# Patient Record
Sex: Male | Born: 1945 | Race: White | Hispanic: No | Marital: Married | Smoking: Never smoker
Health system: Southern US, Community
[De-identification: ages and names within clinical notes are randomized; demographics above are authoritative.]

## PROBLEM LIST (undated history)

## (undated) DIAGNOSIS — I10 Essential (primary) hypertension: Secondary | ICD-10-CM

## (undated) DIAGNOSIS — E7800 Pure hypercholesterolemia, unspecified: Secondary | ICD-10-CM

## (undated) DIAGNOSIS — I214 Non-ST elevation (NSTEMI) myocardial infarction: Secondary | ICD-10-CM

## (undated) DIAGNOSIS — I2510 Atherosclerotic heart disease of native coronary artery without angina pectoris: Secondary | ICD-10-CM

## (undated) HISTORY — PX: LUMBAR SPINE SURGERY: SHX701

## (undated) HISTORY — DX: Essential (primary) hypertension: I10

## (undated) HISTORY — PX: CERVICAL LAMINECTOMY: SHX94

## (undated) HISTORY — DX: Pure hypercholesterolemia, unspecified: E78.00

## (undated) HISTORY — DX: Non-ST elevation (NSTEMI) myocardial infarction: I21.4

## (undated) HISTORY — DX: Atherosclerotic heart disease of native coronary artery without angina pectoris: I25.10

---

## 2015-06-21 DIAGNOSIS — D229 Melanocytic nevi, unspecified: Secondary | ICD-10-CM | POA: Diagnosis not present

## 2015-07-17 DIAGNOSIS — R404 Transient alteration of awareness: Secondary | ICD-10-CM | POA: Diagnosis not present

## 2015-07-17 DIAGNOSIS — R531 Weakness: Secondary | ICD-10-CM | POA: Diagnosis not present

## 2015-07-17 DIAGNOSIS — E785 Hyperlipidemia, unspecified: Secondary | ICD-10-CM | POA: Diagnosis not present

## 2015-07-17 DIAGNOSIS — I119 Hypertensive heart disease without heart failure: Secondary | ICD-10-CM | POA: Diagnosis not present

## 2015-07-17 DIAGNOSIS — R509 Fever, unspecified: Secondary | ICD-10-CM | POA: Diagnosis not present

## 2015-07-17 DIAGNOSIS — R197 Diarrhea, unspecified: Secondary | ICD-10-CM | POA: Diagnosis not present

## 2015-07-17 DIAGNOSIS — R05 Cough: Secondary | ICD-10-CM | POA: Diagnosis not present

## 2015-07-17 DIAGNOSIS — I252 Old myocardial infarction: Secondary | ICD-10-CM | POA: Diagnosis not present

## 2015-07-17 DIAGNOSIS — G114 Hereditary spastic paraplegia: Secondary | ICD-10-CM | POA: Diagnosis not present

## 2015-07-17 DIAGNOSIS — Z7982 Long term (current) use of aspirin: Secondary | ICD-10-CM | POA: Diagnosis not present

## 2016-02-21 DIAGNOSIS — I214 Non-ST elevation (NSTEMI) myocardial infarction: Secondary | ICD-10-CM | POA: Diagnosis not present

## 2016-02-21 DIAGNOSIS — E7800 Pure hypercholesterolemia, unspecified: Secondary | ICD-10-CM | POA: Diagnosis not present

## 2016-02-21 DIAGNOSIS — I10 Essential (primary) hypertension: Secondary | ICD-10-CM | POA: Diagnosis not present

## 2016-02-21 DIAGNOSIS — I2510 Atherosclerotic heart disease of native coronary artery without angina pectoris: Secondary | ICD-10-CM | POA: Diagnosis not present

## 2016-06-22 DIAGNOSIS — R252 Cramp and spasm: Secondary | ICD-10-CM | POA: Diagnosis not present

## 2016-06-22 DIAGNOSIS — M545 Low back pain: Secondary | ICD-10-CM | POA: Diagnosis not present

## 2016-06-22 DIAGNOSIS — M47817 Spondylosis without myelopathy or radiculopathy, lumbosacral region: Secondary | ICD-10-CM | POA: Diagnosis not present

## 2016-06-22 DIAGNOSIS — M48061 Spinal stenosis, lumbar region without neurogenic claudication: Secondary | ICD-10-CM | POA: Diagnosis not present

## 2016-08-03 DIAGNOSIS — M47817 Spondylosis without myelopathy or radiculopathy, lumbosacral region: Secondary | ICD-10-CM | POA: Diagnosis not present

## 2016-08-03 DIAGNOSIS — M5126 Other intervertebral disc displacement, lumbar region: Secondary | ICD-10-CM | POA: Diagnosis not present

## 2016-08-03 DIAGNOSIS — I10 Essential (primary) hypertension: Secondary | ICD-10-CM | POA: Diagnosis not present

## 2016-08-03 DIAGNOSIS — M48061 Spinal stenosis, lumbar region without neurogenic claudication: Secondary | ICD-10-CM | POA: Diagnosis not present

## 2016-08-09 DIAGNOSIS — M542 Cervicalgia: Secondary | ICD-10-CM | POA: Diagnosis not present

## 2016-08-21 DIAGNOSIS — N390 Urinary tract infection, site not specified: Secondary | ICD-10-CM | POA: Diagnosis not present

## 2016-08-21 DIAGNOSIS — M48061 Spinal stenosis, lumbar region without neurogenic claudication: Secondary | ICD-10-CM | POA: Diagnosis not present

## 2016-08-21 DIAGNOSIS — G9741 Accidental puncture or laceration of dura during a procedure: Secondary | ICD-10-CM | POA: Diagnosis not present

## 2016-08-21 DIAGNOSIS — M48062 Spinal stenosis, lumbar region with neurogenic claudication: Secondary | ICD-10-CM | POA: Diagnosis not present

## 2016-08-21 DIAGNOSIS — G114 Hereditary spastic paraplegia: Secondary | ICD-10-CM | POA: Diagnosis not present

## 2016-08-23 DIAGNOSIS — R509 Fever, unspecified: Secondary | ICD-10-CM | POA: Diagnosis not present

## 2016-08-24 DIAGNOSIS — Z831 Family history of other infectious and parasitic diseases: Secondary | ICD-10-CM | POA: Diagnosis not present

## 2016-08-24 DIAGNOSIS — Z7982 Long term (current) use of aspirin: Secondary | ICD-10-CM | POA: Diagnosis not present

## 2016-08-24 DIAGNOSIS — Z820 Family history of epilepsy and other diseases of the nervous system: Secondary | ICD-10-CM | POA: Diagnosis not present

## 2016-08-24 DIAGNOSIS — M21371 Foot drop, right foot: Secondary | ICD-10-CM | POA: Diagnosis present

## 2016-08-24 DIAGNOSIS — G9741 Accidental puncture or laceration of dura during a procedure: Secondary | ICD-10-CM | POA: Diagnosis not present

## 2016-08-24 DIAGNOSIS — I2510 Atherosclerotic heart disease of native coronary artery without angina pectoris: Secondary | ICD-10-CM | POA: Diagnosis present

## 2016-08-24 DIAGNOSIS — R339 Retention of urine, unspecified: Secondary | ICD-10-CM | POA: Diagnosis not present

## 2016-08-24 DIAGNOSIS — M48062 Spinal stenosis, lumbar region with neurogenic claudication: Secondary | ICD-10-CM | POA: Diagnosis present

## 2016-08-24 DIAGNOSIS — Z79899 Other long term (current) drug therapy: Secondary | ICD-10-CM | POA: Diagnosis not present

## 2016-08-24 DIAGNOSIS — I252 Old myocardial infarction: Secondary | ICD-10-CM | POA: Diagnosis not present

## 2016-08-24 DIAGNOSIS — G114 Hereditary spastic paraplegia: Secondary | ICD-10-CM | POA: Diagnosis present

## 2016-08-24 DIAGNOSIS — N390 Urinary tract infection, site not specified: Secondary | ICD-10-CM | POA: Diagnosis not present

## 2016-08-24 DIAGNOSIS — R509 Fever, unspecified: Secondary | ICD-10-CM | POA: Diagnosis not present

## 2016-08-24 DIAGNOSIS — Z880 Allergy status to penicillin: Secondary | ICD-10-CM | POA: Diagnosis not present

## 2016-08-24 DIAGNOSIS — M21372 Foot drop, left foot: Secondary | ICD-10-CM | POA: Diagnosis present

## 2016-08-24 DIAGNOSIS — E7800 Pure hypercholesterolemia, unspecified: Secondary | ICD-10-CM | POA: Diagnosis present

## 2016-08-24 DIAGNOSIS — E785 Hyperlipidemia, unspecified: Secondary | ICD-10-CM | POA: Diagnosis not present

## 2016-08-24 DIAGNOSIS — B961 Klebsiella pneumoniae [K. pneumoniae] as the cause of diseases classified elsewhere: Secondary | ICD-10-CM | POA: Diagnosis not present

## 2016-08-24 DIAGNOSIS — I10 Essential (primary) hypertension: Secondary | ICD-10-CM | POA: Diagnosis present

## 2016-08-28 DIAGNOSIS — N400 Enlarged prostate without lower urinary tract symptoms: Secondary | ICD-10-CM | POA: Diagnosis present

## 2016-08-28 DIAGNOSIS — N390 Urinary tract infection, site not specified: Secondary | ICD-10-CM | POA: Diagnosis present

## 2016-08-28 DIAGNOSIS — B961 Klebsiella pneumoniae [K. pneumoniae] as the cause of diseases classified elsewhere: Secondary | ICD-10-CM | POA: Diagnosis present

## 2016-08-28 DIAGNOSIS — Z4789 Encounter for other orthopedic aftercare: Secondary | ICD-10-CM | POA: Diagnosis not present

## 2016-08-28 DIAGNOSIS — R509 Fever, unspecified: Secondary | ICD-10-CM | POA: Diagnosis not present

## 2016-08-28 DIAGNOSIS — E7800 Pure hypercholesterolemia, unspecified: Secondary | ICD-10-CM | POA: Diagnosis present

## 2016-08-28 DIAGNOSIS — M79604 Pain in right leg: Secondary | ICD-10-CM | POA: Diagnosis not present

## 2016-08-28 DIAGNOSIS — K5900 Constipation, unspecified: Secondary | ICD-10-CM | POA: Diagnosis present

## 2016-08-28 DIAGNOSIS — Z981 Arthrodesis status: Secondary | ICD-10-CM | POA: Diagnosis not present

## 2016-08-28 DIAGNOSIS — I2510 Atherosclerotic heart disease of native coronary artery without angina pectoris: Secondary | ICD-10-CM | POA: Diagnosis present

## 2016-08-28 DIAGNOSIS — M25551 Pain in right hip: Secondary | ICD-10-CM | POA: Diagnosis not present

## 2016-08-28 DIAGNOSIS — I252 Old myocardial infarction: Secondary | ICD-10-CM | POA: Diagnosis not present

## 2016-08-28 DIAGNOSIS — Z880 Allergy status to penicillin: Secondary | ICD-10-CM | POA: Diagnosis not present

## 2016-08-28 DIAGNOSIS — E785 Hyperlipidemia, unspecified: Secondary | ICD-10-CM | POA: Diagnosis present

## 2016-08-28 DIAGNOSIS — I10 Essential (primary) hypertension: Secondary | ICD-10-CM | POA: Diagnosis present

## 2016-08-28 DIAGNOSIS — M4800 Spinal stenosis, site unspecified: Secondary | ICD-10-CM | POA: Diagnosis not present

## 2016-09-09 DIAGNOSIS — I252 Old myocardial infarction: Secondary | ICD-10-CM | POA: Diagnosis not present

## 2016-09-09 DIAGNOSIS — G114 Hereditary spastic paraplegia: Secondary | ICD-10-CM | POA: Diagnosis not present

## 2016-09-09 DIAGNOSIS — M150 Primary generalized (osteo)arthritis: Secondary | ICD-10-CM | POA: Diagnosis not present

## 2016-09-09 DIAGNOSIS — Z7982 Long term (current) use of aspirin: Secondary | ICD-10-CM | POA: Diagnosis not present

## 2016-09-09 DIAGNOSIS — I2510 Atherosclerotic heart disease of native coronary artery without angina pectoris: Secondary | ICD-10-CM | POA: Diagnosis not present

## 2016-09-09 DIAGNOSIS — Z4789 Encounter for other orthopedic aftercare: Secondary | ICD-10-CM | POA: Diagnosis not present

## 2016-09-09 DIAGNOSIS — Z79891 Long term (current) use of opiate analgesic: Secondary | ICD-10-CM | POA: Diagnosis not present

## 2016-09-09 DIAGNOSIS — E785 Hyperlipidemia, unspecified: Secondary | ICD-10-CM | POA: Diagnosis not present

## 2016-09-09 DIAGNOSIS — I10 Essential (primary) hypertension: Secondary | ICD-10-CM | POA: Diagnosis not present

## 2016-09-11 DIAGNOSIS — E7800 Pure hypercholesterolemia, unspecified: Secondary | ICD-10-CM | POA: Diagnosis not present

## 2016-09-11 DIAGNOSIS — Z131 Encounter for screening for diabetes mellitus: Secondary | ICD-10-CM | POA: Diagnosis not present

## 2016-09-11 DIAGNOSIS — Z6825 Body mass index (BMI) 25.0-25.9, adult: Secondary | ICD-10-CM | POA: Diagnosis not present

## 2016-09-11 DIAGNOSIS — I2510 Atherosclerotic heart disease of native coronary artery without angina pectoris: Secondary | ICD-10-CM | POA: Diagnosis not present

## 2016-09-11 DIAGNOSIS — I10 Essential (primary) hypertension: Secondary | ICD-10-CM | POA: Diagnosis not present

## 2016-09-11 DIAGNOSIS — Z9181 History of falling: Secondary | ICD-10-CM | POA: Diagnosis not present

## 2016-09-12 DIAGNOSIS — G114 Hereditary spastic paraplegia: Secondary | ICD-10-CM | POA: Diagnosis not present

## 2016-09-12 DIAGNOSIS — I252 Old myocardial infarction: Secondary | ICD-10-CM | POA: Diagnosis not present

## 2016-09-12 DIAGNOSIS — Z4789 Encounter for other orthopedic aftercare: Secondary | ICD-10-CM | POA: Diagnosis not present

## 2016-09-12 DIAGNOSIS — I10 Essential (primary) hypertension: Secondary | ICD-10-CM | POA: Diagnosis not present

## 2016-09-12 DIAGNOSIS — I2510 Atherosclerotic heart disease of native coronary artery without angina pectoris: Secondary | ICD-10-CM | POA: Diagnosis not present

## 2016-09-12 DIAGNOSIS — M150 Primary generalized (osteo)arthritis: Secondary | ICD-10-CM | POA: Diagnosis not present

## 2016-09-14 DIAGNOSIS — M150 Primary generalized (osteo)arthritis: Secondary | ICD-10-CM | POA: Diagnosis not present

## 2016-09-14 DIAGNOSIS — I2510 Atherosclerotic heart disease of native coronary artery without angina pectoris: Secondary | ICD-10-CM | POA: Diagnosis not present

## 2016-09-14 DIAGNOSIS — G114 Hereditary spastic paraplegia: Secondary | ICD-10-CM | POA: Diagnosis not present

## 2016-09-14 DIAGNOSIS — I10 Essential (primary) hypertension: Secondary | ICD-10-CM | POA: Diagnosis not present

## 2016-09-14 DIAGNOSIS — Z4789 Encounter for other orthopedic aftercare: Secondary | ICD-10-CM | POA: Diagnosis not present

## 2016-09-14 DIAGNOSIS — I252 Old myocardial infarction: Secondary | ICD-10-CM | POA: Diagnosis not present

## 2016-09-19 DIAGNOSIS — I10 Essential (primary) hypertension: Secondary | ICD-10-CM | POA: Diagnosis not present

## 2016-09-19 DIAGNOSIS — I252 Old myocardial infarction: Secondary | ICD-10-CM | POA: Diagnosis not present

## 2016-09-19 DIAGNOSIS — M150 Primary generalized (osteo)arthritis: Secondary | ICD-10-CM | POA: Diagnosis not present

## 2016-09-19 DIAGNOSIS — G114 Hereditary spastic paraplegia: Secondary | ICD-10-CM | POA: Diagnosis not present

## 2016-09-19 DIAGNOSIS — Z4789 Encounter for other orthopedic aftercare: Secondary | ICD-10-CM | POA: Diagnosis not present

## 2016-09-19 DIAGNOSIS — I2510 Atherosclerotic heart disease of native coronary artery without angina pectoris: Secondary | ICD-10-CM | POA: Diagnosis not present

## 2016-09-26 DIAGNOSIS — Z4789 Encounter for other orthopedic aftercare: Secondary | ICD-10-CM | POA: Diagnosis not present

## 2016-09-26 DIAGNOSIS — I10 Essential (primary) hypertension: Secondary | ICD-10-CM | POA: Diagnosis not present

## 2016-09-26 DIAGNOSIS — M150 Primary generalized (osteo)arthritis: Secondary | ICD-10-CM | POA: Diagnosis not present

## 2016-09-26 DIAGNOSIS — G114 Hereditary spastic paraplegia: Secondary | ICD-10-CM | POA: Diagnosis not present

## 2016-09-26 DIAGNOSIS — I2510 Atherosclerotic heart disease of native coronary artery without angina pectoris: Secondary | ICD-10-CM | POA: Diagnosis not present

## 2016-09-26 DIAGNOSIS — I252 Old myocardial infarction: Secondary | ICD-10-CM | POA: Diagnosis not present

## 2016-09-28 DIAGNOSIS — I252 Old myocardial infarction: Secondary | ICD-10-CM | POA: Diagnosis not present

## 2016-09-28 DIAGNOSIS — Z4789 Encounter for other orthopedic aftercare: Secondary | ICD-10-CM | POA: Diagnosis not present

## 2016-09-28 DIAGNOSIS — I10 Essential (primary) hypertension: Secondary | ICD-10-CM | POA: Diagnosis not present

## 2016-09-28 DIAGNOSIS — I2510 Atherosclerotic heart disease of native coronary artery without angina pectoris: Secondary | ICD-10-CM | POA: Diagnosis not present

## 2016-09-28 DIAGNOSIS — M150 Primary generalized (osteo)arthritis: Secondary | ICD-10-CM | POA: Diagnosis not present

## 2016-09-28 DIAGNOSIS — G114 Hereditary spastic paraplegia: Secondary | ICD-10-CM | POA: Diagnosis not present

## 2016-10-02 DIAGNOSIS — M150 Primary generalized (osteo)arthritis: Secondary | ICD-10-CM | POA: Diagnosis not present

## 2016-10-02 DIAGNOSIS — I2510 Atherosclerotic heart disease of native coronary artery without angina pectoris: Secondary | ICD-10-CM | POA: Diagnosis not present

## 2016-10-02 DIAGNOSIS — G114 Hereditary spastic paraplegia: Secondary | ICD-10-CM | POA: Diagnosis not present

## 2016-10-02 DIAGNOSIS — Z4789 Encounter for other orthopedic aftercare: Secondary | ICD-10-CM | POA: Diagnosis not present

## 2016-10-02 DIAGNOSIS — I252 Old myocardial infarction: Secondary | ICD-10-CM | POA: Diagnosis not present

## 2016-10-02 DIAGNOSIS — I10 Essential (primary) hypertension: Secondary | ICD-10-CM | POA: Diagnosis not present

## 2016-10-05 DIAGNOSIS — M150 Primary generalized (osteo)arthritis: Secondary | ICD-10-CM | POA: Diagnosis not present

## 2016-10-05 DIAGNOSIS — G114 Hereditary spastic paraplegia: Secondary | ICD-10-CM | POA: Diagnosis not present

## 2016-10-05 DIAGNOSIS — I10 Essential (primary) hypertension: Secondary | ICD-10-CM | POA: Diagnosis not present

## 2016-10-05 DIAGNOSIS — Z4789 Encounter for other orthopedic aftercare: Secondary | ICD-10-CM | POA: Diagnosis not present

## 2016-10-05 DIAGNOSIS — I252 Old myocardial infarction: Secondary | ICD-10-CM | POA: Diagnosis not present

## 2016-10-05 DIAGNOSIS — I2510 Atherosclerotic heart disease of native coronary artery without angina pectoris: Secondary | ICD-10-CM | POA: Diagnosis not present

## 2016-10-17 DIAGNOSIS — M545 Low back pain: Secondary | ICD-10-CM | POA: Diagnosis not present

## 2016-10-17 DIAGNOSIS — G114 Hereditary spastic paraplegia: Secondary | ICD-10-CM | POA: Diagnosis not present

## 2016-10-19 DIAGNOSIS — G114 Hereditary spastic paraplegia: Secondary | ICD-10-CM | POA: Diagnosis not present

## 2016-10-19 DIAGNOSIS — M545 Low back pain: Secondary | ICD-10-CM | POA: Diagnosis not present

## 2016-10-24 DIAGNOSIS — G114 Hereditary spastic paraplegia: Secondary | ICD-10-CM | POA: Diagnosis not present

## 2016-10-24 DIAGNOSIS — M545 Low back pain: Secondary | ICD-10-CM | POA: Diagnosis not present

## 2016-10-26 DIAGNOSIS — G114 Hereditary spastic paraplegia: Secondary | ICD-10-CM | POA: Diagnosis not present

## 2016-10-26 DIAGNOSIS — M545 Low back pain: Secondary | ICD-10-CM | POA: Diagnosis not present

## 2016-10-31 DIAGNOSIS — M545 Low back pain: Secondary | ICD-10-CM | POA: Diagnosis not present

## 2016-10-31 DIAGNOSIS — G114 Hereditary spastic paraplegia: Secondary | ICD-10-CM | POA: Diagnosis not present

## 2016-11-02 DIAGNOSIS — M545 Low back pain: Secondary | ICD-10-CM | POA: Diagnosis not present

## 2016-11-02 DIAGNOSIS — G114 Hereditary spastic paraplegia: Secondary | ICD-10-CM | POA: Diagnosis not present

## 2016-11-07 DIAGNOSIS — M545 Low back pain: Secondary | ICD-10-CM | POA: Diagnosis not present

## 2016-11-07 DIAGNOSIS — G114 Hereditary spastic paraplegia: Secondary | ICD-10-CM | POA: Diagnosis not present

## 2016-11-09 DIAGNOSIS — G114 Hereditary spastic paraplegia: Secondary | ICD-10-CM | POA: Diagnosis not present

## 2016-11-09 DIAGNOSIS — M545 Low back pain: Secondary | ICD-10-CM | POA: Diagnosis not present

## 2016-11-14 DIAGNOSIS — M545 Low back pain: Secondary | ICD-10-CM | POA: Diagnosis not present

## 2016-11-14 DIAGNOSIS — G114 Hereditary spastic paraplegia: Secondary | ICD-10-CM | POA: Diagnosis not present

## 2016-11-16 DIAGNOSIS — G114 Hereditary spastic paraplegia: Secondary | ICD-10-CM | POA: Diagnosis not present

## 2016-11-16 DIAGNOSIS — M545 Low back pain: Secondary | ICD-10-CM | POA: Diagnosis not present

## 2016-11-22 DIAGNOSIS — M545 Low back pain: Secondary | ICD-10-CM | POA: Diagnosis not present

## 2016-11-22 DIAGNOSIS — G114 Hereditary spastic paraplegia: Secondary | ICD-10-CM | POA: Diagnosis not present

## 2016-11-23 DIAGNOSIS — R102 Pelvic and perineal pain: Secondary | ICD-10-CM | POA: Diagnosis not present

## 2016-11-23 DIAGNOSIS — M48061 Spinal stenosis, lumbar region without neurogenic claudication: Secondary | ICD-10-CM | POA: Diagnosis not present

## 2016-11-24 DIAGNOSIS — M545 Low back pain: Secondary | ICD-10-CM | POA: Diagnosis not present

## 2016-11-24 DIAGNOSIS — G114 Hereditary spastic paraplegia: Secondary | ICD-10-CM | POA: Diagnosis not present

## 2016-11-28 DIAGNOSIS — G114 Hereditary spastic paraplegia: Secondary | ICD-10-CM | POA: Diagnosis not present

## 2016-11-28 DIAGNOSIS — M545 Low back pain: Secondary | ICD-10-CM | POA: Diagnosis not present

## 2016-11-30 DIAGNOSIS — M545 Low back pain: Secondary | ICD-10-CM | POA: Diagnosis not present

## 2016-11-30 DIAGNOSIS — G114 Hereditary spastic paraplegia: Secondary | ICD-10-CM | POA: Diagnosis not present

## 2017-05-14 DIAGNOSIS — Z6825 Body mass index (BMI) 25.0-25.9, adult: Secondary | ICD-10-CM | POA: Diagnosis not present

## 2017-05-14 DIAGNOSIS — G114 Hereditary spastic paraplegia: Secondary | ICD-10-CM | POA: Diagnosis not present

## 2017-05-14 DIAGNOSIS — E785 Hyperlipidemia, unspecified: Secondary | ICD-10-CM | POA: Diagnosis not present

## 2017-05-24 DIAGNOSIS — Z125 Encounter for screening for malignant neoplasm of prostate: Secondary | ICD-10-CM | POA: Diagnosis not present

## 2017-05-24 DIAGNOSIS — Z0000 Encounter for general adult medical examination without abnormal findings: Secondary | ICD-10-CM | POA: Diagnosis not present

## 2017-05-24 DIAGNOSIS — E785 Hyperlipidemia, unspecified: Secondary | ICD-10-CM | POA: Diagnosis not present

## 2017-06-11 ENCOUNTER — Encounter: Payer: Self-pay | Admitting: Cardiovascular Disease

## 2017-06-11 ENCOUNTER — Ambulatory Visit (INDEPENDENT_AMBULATORY_CARE_PROVIDER_SITE_OTHER): Payer: Medicare Other | Admitting: Cardiovascular Disease

## 2017-06-11 ENCOUNTER — Other Ambulatory Visit: Payer: Self-pay

## 2017-06-11 VITALS — BP 138/78 | HR 60 | Ht 69.0 in | Wt 177.6 lb

## 2017-06-11 DIAGNOSIS — I10 Essential (primary) hypertension: Secondary | ICD-10-CM | POA: Diagnosis not present

## 2017-06-11 DIAGNOSIS — I252 Old myocardial infarction: Secondary | ICD-10-CM | POA: Diagnosis not present

## 2017-06-11 DIAGNOSIS — I25118 Atherosclerotic heart disease of native coronary artery with other forms of angina pectoris: Secondary | ICD-10-CM | POA: Diagnosis not present

## 2017-06-11 MED ORDER — PRAVASTATIN SODIUM 40 MG PO TABS: 40.0000 mg | ORAL_TABLET | Freq: Every day | ORAL | 3 refills | Status: DC

## 2017-06-11 MED ORDER — METOPROLOL SUCCINATE ER 25 MG PO TB24: 25.0000 mg | ORAL_TABLET | Freq: Every day | ORAL | 3 refills | Status: DC

## 2017-06-11 NOTE — Patient Instructions (Signed)
Your physician wants you to follow-up in:  1 year with Dr.Koneswaran You will receive a reminder letter in the mail two months in advance. If you don't receive a letter, please call our office to schedule the follow-up appointment.    Your physician recommends that you continue on your current medications as directed. Please refer to the Current Medication list given to you today.    If you need a refill on your cardiac medications before your next appointment, please call your pharmacy.      No lab work or tests ordered today.      Thank you for choosing [facility] !        

## 2017-06-11 NOTE — Progress Notes (Signed)
CARDIOLOGY CONSULT NOTE  Patient ID: Barry Powers MRN: [id number] DOB/AGE: [birth year] 71 y.o.  Admit date: (Not on file) Primary Physician: Celene Squibb, MD Referring Physician: Dr. Nevada Crane  Reason for Consultation: Coronary artery disease  HPI: Barry Powers is a 72 y.o. male who is being seen today for the evaluation of coronary artery disease at the request of Dr. Wende Neighbors.  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  He apparently underwent coronary angiography and was found to have a subtotal occlusion of a small diagonal branch of the LAD which was too small for intervention.  He had minor nonobstructive disease otherwise.  ECG performed today which I personally reviewed demonstrated sinus rhythm with precordial T wave inversions.  I have no old ECG to compare to.  The patient denies any symptoms of chest pain, palpitations, shortness of breath, lightheadedness, dizziness, leg swelling, orthopnea, PND, and syncope.  He is here with his wife who is also my patient.  He served with the [employer] for 20 years and retired early in 1991 due to health conditions.  He has a history of hereditary spastic paraplegia and apparently his father had this as well.  He was evaluated at [facility] who said there is nothing that can be done.    Allergies  Allergen Reactions  . Penicillins Other (See Comments)    "Blacked-out"    Current Outpatient Medications  Medication Sig Dispense Refill  . aspirin EC 81 MG tablet Take 81 mg by mouth daily.    . metoprolol succinate (TOPROL-XL) 25 MG 24 hr tablet Take 25 mg by mouth daily.    . nitroGLYCERIN (NITROSTAT) 0.4 MG SL tablet Place 0.4 mg under the tongue every 5 (five) minutes as needed for chest pain.    . pravastatin (PRAVACHOL) 40 MG tablet Take 40 mg by mouth daily.     No current facility-administered medications for this  visit.     Past Medical History:  Diagnosis Date  . Coronary artery disease   . Hypercholesteremia   . Hypertension   . NSTEMI (non-ST elevated myocardial infarction) [facility])     History reviewed. No pertinent surgical history.  Social History   Socioeconomic History  . Marital status: Married    Spouse name: Not on file  . Number of children: Not on file  . Years of education: Not on file  . Highest education level: Not on file  Social Needs  . Financial resource strain: Not on file  . Food insecurity - worry: Not on file  . Food insecurity - inability: Not on file  . Transportation needs - medical: Not on file  . Transportation needs - non-medical: Not on file  Occupational History  . Not on file  Tobacco Use  . Smoking status: Never Smoker  . Smokeless tobacco: Never Used  Substance and Sexual Activity  . Alcohol use: No    Frequency: Never  . Drug use: No  . Sexual activity: Not on file  Other Topics Concern  . Not on file  Social History Narrative  . Not on file     No family history of premature CAD in 1st degree relatives.  No outpatient medications have been marked as taking for the 06/11/17 encounter (Office Visit) with Herminio Commons, MD.      Review of systems complete and found to be  negative unless listed above in HPI    Physical exam Blood pressure (!) 148/70, pulse 61, height 5\' 9"  (1.753 m), weight 177 lb 9.6 oz (80.6 kg), SpO2 97 %. General: NAD Neck: No JVD, no thyromegaly or thyroid nodule.  Lungs: Clear to auscultation bilaterally with normal respiratory effort. CV: Nondisplaced PMI. Regular rate and rhythm, normal S1/S2, no S3/S4, no murmur.  No peripheral edema.  No carotid bruit.    Abdomen: Soft, nontender, no distention.  Skin: Intact without lesions or rashes.  Neurologic: Alert and oriented x 3.  Psych: Normal affect. Extremities: No clubbing or cyanosis.  HEENT: Normal.   ECG: Most recent ECG reviewed.   Labs: No  results found for: K, BUN, CREATININE, ALT, TSH, HGB   Lipids: No results found for: LDLCALC, LDLDIRECT, CHOL, TRIG, HDL      ASSESSMENT AND PLAN:  1.  Coronary artery disease with history of non-STEMI with subtotal occlusion of small diagonal branch and otherwise minor nonobstructive disease: Symptomatically stable.  Currently on aspirin, Toprol-XL 25 mg, and pravastatin 40 mg.  No changes to therapy. I will obtain a copy of cardiac records from his prior cardiologist including any cardiac testing including echocardiograms and cardiac catheterization report as well as old ECGs.  2.  Hypertension: Blood pressure is elevated today.  He is on Toprol-XL 25 mg daily.  I will monitor to see if further antihypertensive titration is indicated.   Disposition: Follow up in 1 year  Signed: Kate Sable, M.D., F.A.C.C.  06/11/2017, 9:26 AM

## 2017-06-14 DIAGNOSIS — Z0000 Encounter for general adult medical examination without abnormal findings: Secondary | ICD-10-CM | POA: Diagnosis not present

## 2017-06-14 DIAGNOSIS — R972 Elevated prostate specific antigen [PSA]: Secondary | ICD-10-CM | POA: Diagnosis not present

## 2017-06-14 DIAGNOSIS — Z713 Dietary counseling and surveillance: Secondary | ICD-10-CM | POA: Diagnosis not present

## 2017-06-14 DIAGNOSIS — Z6825 Body mass index (BMI) 25.0-25.9, adult: Secondary | ICD-10-CM | POA: Diagnosis not present

## 2017-06-14 DIAGNOSIS — E785 Hyperlipidemia, unspecified: Secondary | ICD-10-CM | POA: Diagnosis not present

## 2017-10-01 DIAGNOSIS — H52223 Regular astigmatism, bilateral: Secondary | ICD-10-CM | POA: Diagnosis not present

## 2017-10-01 DIAGNOSIS — H524 Presbyopia: Secondary | ICD-10-CM | POA: Diagnosis not present

## 2017-10-01 DIAGNOSIS — H5203 Hypermetropia, bilateral: Secondary | ICD-10-CM | POA: Diagnosis not present

## 2017-10-01 DIAGNOSIS — H2513 Age-related nuclear cataract, bilateral: Secondary | ICD-10-CM | POA: Diagnosis not present

## 2018-06-12 ENCOUNTER — Ambulatory Visit (INDEPENDENT_AMBULATORY_CARE_PROVIDER_SITE_OTHER): Payer: Medicare Other | Admitting: Cardiovascular Disease

## 2018-06-12 ENCOUNTER — Encounter: Payer: Self-pay | Admitting: Cardiovascular Disease

## 2018-06-12 VITALS — BP 142/82 | HR 56 | Ht 69.0 in | Wt 174.0 lb

## 2018-06-12 DIAGNOSIS — I252 Old myocardial infarction: Secondary | ICD-10-CM

## 2018-06-12 DIAGNOSIS — I25118 Atherosclerotic heart disease of native coronary artery with other forms of angina pectoris: Secondary | ICD-10-CM | POA: Diagnosis not present

## 2018-06-12 DIAGNOSIS — I10 Essential (primary) hypertension: Secondary | ICD-10-CM

## 2018-06-12 MED ORDER — NITROGLYCERIN 0.4 MG SL SUBL: 0.4000 mg | SUBLINGUAL_TABLET | SUBLINGUAL | 3 refills | Status: DC | PRN

## 2018-06-12 NOTE — Progress Notes (Addendum)
SUBJECTIVE: The patient presents for routine follow-up.  He sustained a non-STEMI in August 2017 and used to be followed by [facility].  He apparently underwent coronary angiography and was found to have a subtotal occlusion of a small diagonal branch of the LAD which was too small for intervention.  He had minor nonobstructive disease otherwise.  He has a history of hereditary spastic paraplegia and apparently his father had this as well.  He was evaluated at [facility] who said there is nothing that can be done.  The patient denies any symptoms of chest pain, palpitations, shortness of breath, lightheadedness, dizziness, leg swelling, orthopnea, PND, and syncope.  ECG performed today which I ordered and personally interpreted demonstrates sinus bradycardia, 53 bpm, LVH, and diffuse nonspecific ST segment and T wave abnormalities.   Social history: His wife is also my patient. He served with the [employer] for 20 years and retired early in 1991 due to health conditions.  Review of Systems: As per "subjective", otherwise negative.  Allergies  Allergen Reactions  . Penicillins Other (See Comments)    "Blacked-out"    Current Outpatient Medications  Medication Sig Dispense Refill  . aspirin EC 81 MG tablet Take 81 mg by mouth daily.    . metoprolol succinate (TOPROL-XL) 25 MG 24 hr tablet Take 1 tablet (25 mg total) by mouth daily. 90 tablet 3  . nitroGLYCERIN (NITROSTAT) 0.4 MG SL tablet Place 0.4 mg under the tongue every 5 (five) minutes as needed for chest pain.    . pravastatin (PRAVACHOL) 40 MG tablet Take 1 tablet (40 mg total) by mouth daily. 90 tablet 3   No current facility-administered medications for this visit.     Past Medical History:  Diagnosis Date  . Coronary artery disease   . Hypercholesteremia   . Hypertension   . NSTEMI (non-ST elevated myocardial infarction) [facility])     Past Surgical History:  Procedure Laterality Date  .  CERVICAL LAMINECTOMY    . LUMBAR SPINE SURGERY      Social History   Socioeconomic History  . Marital status: Married    Spouse name: Not on file  . Number of children: Not on file  . Years of education: Not on file  . Highest education level: Not on file  Occupational History  . Not on file  Social Needs  . Financial resource strain: Not on file  . Food insecurity:    Worry: Not on file    Inability: Not on file  . Transportation needs:    Medical: Not on file    Non-medical: Not on file  Tobacco Use  . Smoking status: Never Smoker  . Smokeless tobacco: Never Used  Substance and Sexual Activity  . Alcohol use: No    Frequency: Never  . Drug use: No  . Sexual activity: Not on file  Lifestyle  . Physical activity:    Days per week: Not on file    Minutes per session: Not on file  . Stress: Not on file  Relationships  . Social connections:    Talks on phone: Not on file    Gets together: Not on file    Attends religious service: Not on file    Active member of club or organization: Not on file    Attends meetings of clubs or organizations: Not on file    Relationship status: Not on file  . Intimate partner violence:    Fear of  current or ex partner: Not on file    Emotionally abused: Not on file    Physically abused: Not on file    Forced sexual activity: Not on file  Other Topics Concern  . Not on file  Social History Narrative  . Not on file     Vitals:   06/12/18 1427  BP: (!) 142/82  Pulse: (!) 56  SpO2: 97%  Weight: 174 lb (78.9 kg)  Height: 5\' 9"  (1.753 m)    Wt Readings from Last 3 Encounters:  06/12/18 174 lb (78.9 kg)  06/11/17 177 lb 9.6 oz (80.6 kg)  04/26/17 171 lb (77.6 kg)     PHYSICAL EXAM General: NAD HEENT: Normal. Neck: No JVD, no thyromegaly. Lungs: Clear to auscultation bilaterally with normal respiratory effort. CV: Bradycardic, regular rhythm, normal S1/S2, no S3/S4, no murmur. No pretibial or periankle edema.  No carotid  bruit.   Abdomen: Soft, nontender, no distention.  Neurologic: Alert and oriented.  Psych: Normal affect. Skin: Normal. Musculoskeletal: No gross deformities.    ECG: Reviewed above under Subjective   Labs: No results found for: K, BUN, CREATININE, ALT, TSH, HGB   Lipids: No results found for: LDLCALC, LDLDIRECT, CHOL, TRIG, HDL     ASSESSMENT AND PLAN: 1.  Coronary artery disease: He has a history of non-STEMI with subtotal occlusion of small diagonal branch and otherwise minor nonobstructive disease: Symptomatically stable.  Currently on aspirin, Toprol-XL 25 mg, and pravastatin 40 mg.  No changes to therapy.  I will refill nitroglycerin.  I will check a lipid panel.  2.  Hypertension: BP is mildly elevated.  He said he was in a hurry to get here.  No changes to therapy today.    Disposition: Follow up 1 year.   Kate Sable, M.D., F.A.C.C.

## 2018-06-12 NOTE — Patient Instructions (Signed)
Medication Instructions:  Continue all current medications.  Labwork:  Lipids - order given today.   Reminder:  Nothing to eat or drink after 12 midnight prior to labs.  Office will contact with results via phone or letter.    Follow-Up: Your physician wants you to follow up in:  1 year.  You will receive a reminder letter in the mail one-two months in advance.  If you don't receive a letter, please call our office to schedule the follow up appointment   Any Other Special Instructions Will Be Listed Below (If Applicable).  If you need a refill on your cardiac medications before your next appointment, please call your pharmacy.

## 2018-08-23 ENCOUNTER — Other Ambulatory Visit: Payer: Self-pay | Admitting: Cardiovascular Disease

## 2018-08-26 ENCOUNTER — Other Ambulatory Visit: Payer: Self-pay | Admitting: Cardiovascular Disease

## 2018-08-26 MED ORDER — METOPROLOL SUCCINATE ER 25 MG PO TB24: 25.0000 mg | ORAL_TABLET | Freq: Every day | ORAL | 3 refills | Status: DC

## 2018-08-26 NOTE — Telephone Encounter (Signed)
Refilled toprol

## 2018-08-26 NOTE — Telephone Encounter (Signed)
Needs to have refill on Toprol XL   [facility], [place], [place]

## 2018-12-09 ENCOUNTER — Other Ambulatory Visit: Payer: Self-pay | Admitting: Cardiovascular Disease

## 2019-06-20 ENCOUNTER — Ambulatory Visit: Payer: Medicare Other | Attending: Internal Medicine

## 2019-06-20 DIAGNOSIS — Z23 Encounter for immunization: Secondary | ICD-10-CM

## 2019-06-20 NOTE — Progress Notes (Signed)
   Covid-19 Vaccination Clinic  Name:  Barry Powers    MRN: [id number] DOB: 1945-12-11  06/20/2019  [name] was observed post Covid-19 immunization for 15 minutes without incident. He was provided with Vaccine Information Sheet and instruction to access the V-Safe system.   [name] was instructed to call 911 with any severe reactions post vaccine: [name] Kitchen Difficulty breathing  . Swelling of face and throat  . A fast heartbeat  . A bad rash all over body  . Dizziness and weakness   Immunizations Administered    Name Date Dose VIS Date Route   Moderna COVID-19 Vaccine 06/20/2019  8:23 AM 0.5 mL 03/11/2019 Intramuscular   Manufacturer: Moderna   Lot: [id number]   [id number]

## 2019-07-15 ENCOUNTER — Other Ambulatory Visit: Payer: Self-pay | Admitting: *Deleted

## 2019-07-15 MED ORDER — PRAVASTATIN SODIUM 40 MG PO TABS: 40.0000 mg | ORAL_TABLET | Freq: Every day | ORAL | 0 refills | Status: DC

## 2019-07-16 ENCOUNTER — Ambulatory Visit: Payer: Medicare Other | Admitting: Cardiovascular Disease

## 2019-07-23 ENCOUNTER — Ambulatory Visit: Payer: Medicare Other | Attending: Internal Medicine

## 2019-07-23 DIAGNOSIS — Z23 Encounter for immunization: Secondary | ICD-10-CM

## 2019-07-23 NOTE — Progress Notes (Signed)
   Covid-19 Vaccination Clinic  Name:  Barry Powers    MRN: [id number] DOB: 04-Mar-1946  07/23/2019  [name] was observed post Covid-19 immunization for 30 minutes based on pre-vaccination screening without incident. He was provided with Vaccine Information Sheet and instruction to access the V-Safe system.   [name] was instructed to call 911 with any severe reactions post vaccine: [name] Kitchen Difficulty breathing  . Swelling of face and throat  . A fast heartbeat  . A bad rash all over body  . Dizziness and weakness   Immunizations Administered    Name Date Dose VIS Date Route   Moderna COVID-19 Vaccine 07/23/2019  8:13 AM 0.5 mL 03/11/2019 Intramuscular   Manufacturer: Moderna   LotMV:[id number]   [place]BE:[id number]

## 2019-07-23 NOTE — Progress Notes (Signed)
   Covid-19 Vaccination Clinic  Name:  Barry Powers    MRN: [id number] DOB: 07/11/1945  07/23/2019  [name] was observed post Covid-19 immunization for 30 minutes based on pre-vaccination screening without incident. He was provided with Vaccine Information Sheet and instruction to access the V-Safe system.   [name] was instructed to call 911 with any severe reactions post vaccine: [name] Kitchen Difficulty breathing  . Swelling of face and throat  . A fast heartbeat  . A bad rash all over body  . Dizziness and weakness   Immunizations Administered    Name Date Dose VIS Date Route   Moderna COVID-19 Vaccine 07/23/2019  8:13 AM 0.5 mL 03/11/2019 Intramuscular   Manufacturer: Moderna   LotMV:[id number]   [place]BE:[id number]

## 2019-08-29 ENCOUNTER — Encounter: Payer: Self-pay | Admitting: Cardiovascular Disease

## 2019-08-29 ENCOUNTER — Ambulatory Visit (INDEPENDENT_AMBULATORY_CARE_PROVIDER_SITE_OTHER): Payer: Medicare Other | Admitting: Cardiovascular Disease

## 2019-08-29 ENCOUNTER — Other Ambulatory Visit: Payer: Self-pay

## 2019-08-29 VITALS — BP 130/80 | HR 54 | Ht 69.0 in | Wt 177.0 lb

## 2019-08-29 DIAGNOSIS — H524 Presbyopia: Secondary | ICD-10-CM | POA: Diagnosis not present

## 2019-08-29 DIAGNOSIS — I252 Old myocardial infarction: Secondary | ICD-10-CM

## 2019-08-29 DIAGNOSIS — I10 Essential (primary) hypertension: Secondary | ICD-10-CM | POA: Diagnosis not present

## 2019-08-29 DIAGNOSIS — I25118 Atherosclerotic heart disease of native coronary artery with other forms of angina pectoris: Secondary | ICD-10-CM | POA: Diagnosis not present

## 2019-08-29 DIAGNOSIS — H5203 Hypermetropia, bilateral: Secondary | ICD-10-CM | POA: Diagnosis not present

## 2019-08-29 DIAGNOSIS — H2513 Age-related nuclear cataract, bilateral: Secondary | ICD-10-CM | POA: Diagnosis not present
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
## 2019-08-29 NOTE — Patient Instructions (Signed)

## 2019-11-03 ENCOUNTER — Other Ambulatory Visit: Payer: Self-pay | Admitting: Family Medicine

## 2019-11-04 ENCOUNTER — Other Ambulatory Visit: Payer: Self-pay | Admitting: Family Medicine

## 2019-11-04 MED ORDER — METOPROLOL SUCCINATE ER 25 MG PO TB24: 25.0000 mg | ORAL_TABLET | Freq: Every day | ORAL | 3 refills | Status: DC

## 2020-03-18 ENCOUNTER — Ambulatory Visit: Payer: Medicare Other | Attending: Internal Medicine

## 2020-03-18 DIAGNOSIS — Z23 Encounter for immunization: Secondary | ICD-10-CM

## 2020-03-18 NOTE — Progress Notes (Signed)
   Covid-19 Vaccination Clinic  Name:  Barry Powers    MRN: [id number] DOB: [birth year]  03/18/2020  Mr. College was observed post Covid-19 immunization for 15 minutes without incident. He was provided with Vaccine Information Sheet and instruction to access the V-Safe system.   [name] was instructed to call 911 with any severe reactions post vaccine: [name] Kitchen Difficulty breathing  . Swelling of face and throat  . A fast heartbeat  . A bad rash all over body  . Dizziness and weakness   Immunizations Administered    No immunizations on file.

## 2020-03-18 NOTE — Progress Notes (Signed)
   Covid-19 Vaccination Clinic  Name:  Barry Powers    MRN: [id number] DOB: November 07, 1945  03/18/2020  [name] was observed post Covid-19 immunization for 30 minutes based on pre-vaccination screening without incident. He was provided with Vaccine Information Sheet and instruction to access the V-Safe system.   [name] was instructed to call 911 with any severe reactions post vaccine: [name] Kitchen Difficulty breathing  . Swelling of face and throat  . A fast heartbeat  . A bad rash all over body  . Dizziness and weakness   Immunizations Administered    No immunizations on file.

## 2020-09-27 ENCOUNTER — Telehealth: Payer: Self-pay | Admitting: Cardiology

## 2020-09-27 MED ORDER — PRAVASTATIN SODIUM 40 MG PO TABS: 40.0000 mg | ORAL_TABLET | Freq: Every day | ORAL | 1 refills | Status: DC

## 2020-09-27 NOTE — Telephone Encounter (Signed)
   Can provide 30-days with 1 refill. Will need appointment for additional refills.   Signed, Erma Heritage, PA-C 09/27/2020, 4:46 PM Pager: [phone]

## 2020-09-27 NOTE — Telephone Encounter (Signed)
New message     *STAT* If patient is at the pharmacy, call can be transferred to refill team.   1. Which medications need to be refilled? (please list name of each medication and dose if known)  pravastatin (PRAVACHOL) 40 MG tablet  2. Which pharmacy/location (including street and city if local pharmacy) is medication to be sent to? [facility] in [place]  3. Do they need a 30 day or 90 day supply?30   Informed patient he needs an appointment, he said he does not want to make one right now

## 2020-09-27 NOTE — Telephone Encounter (Signed)
Medication refill request approved and sent to the pharmacy.

## 2020-10-05 ENCOUNTER — Telehealth: Payer: Self-pay | Admitting: Cardiology

## 2020-10-05 MED ORDER — METOPROLOL SUCCINATE ER 25 MG PO TB24: 25.0000 mg | ORAL_TABLET | Freq: Every day | ORAL | 1 refills | Status: DC

## 2020-10-05 NOTE — Telephone Encounter (Signed)
*  STAT* If patient is at the pharmacy, call can be transferred to refill team.   1. Which medications need to be refilled? (please list name of each medication and dose if known)  metoprolol succinate (TOPROL-XL) 25 MG 24 hr tablet [[id number]]   2. Which pharmacy/location (including street and city if local pharmacy) is medication to be sent to? [place]   3. Do they need a 30 day or 90 day supply?  30 day

## 2020-10-25 ENCOUNTER — Telehealth: Payer: Self-pay | Admitting: Cardiology

## 2020-10-25 NOTE — Telephone Encounter (Signed)
States that he has not seen Dr. Nevada Crane in quite some time & is getting new pcp - waiting on Dr. Quintin Alto to send papers for new patient but has not received yet.  States that something is going on with his hip and can't walk good.  Needs referral before neurologist will see him.  Patient last seen 08/29/2019.    Advised patient that she can discuss with provider at [facility] in August as this should be addressed by pcp.  Advised her to contact [place] office in regards to the new patient appointment.  She verbalized understanding.

## 2020-10-25 NOTE — Telephone Encounter (Signed)
Patient's wife(Linda) called wanted Dr  Domenic Polite  to send referral for a Neurologist. They can be reached at  ph# [phone]

## 2020-11-08 NOTE — Progress Notes (Addendum)
Cardiology Office Note  Date: 11/09/2020   ID: Hashim, Sermersheim 1945-09-21, MRN [id number]  PCP:  Celene Squibb, MD  Cardiologist:  None Electrophysiologist:  None   Chief Complaint: 1 year follow-up  History of Present Illness: POLLARD NOU is a 75 y.o. male with a history of CAD, HLD, HTN, history of NSTEMI.  He was last seen by Dr. Bronson Ing on 08/29/2019.  He had a previous non-STEMI in August 2017 and underwent coronary angiography and was found to have a subtotal occlusion of a small diagonal branch of the LAD which was too small for intervention.  Had minor nonobstructive disease otherwise.  History of hereditary spastic paraplegia.  He denied any chest pain, palpitation, shortness of breath, lightheadedness, dizziness, swelling, orthopnea, PND, syncope.  He was symptomatically stable with his CAD.  He was currently taking aspirin, Toprol-XL 25 mg daily along with pravastatin 40 mg daily.  There were no changes to therapy.  Blood pressure was normal and no changes to therapy.  He is here today for 1 year follow-up.  He denies any recent acute illnesses or hospitalizations.  Denies any recent anginal or exertional symptoms or nitroglycerin use.  Denies any DOE or SOB.  Denies any orthostatic symptoms, CVA or TIA-like symptoms, PND, orthopnea, bleeding.  Denies any claudication-like symptoms.  Denies any DVT or PE-like symptoms, or lower extremity edema.  He does have a history of hereditary spastic paraplegia.  He states he is having issues with frequent falls due to weakness in his legs and he has some numbness in his right knee area.  He states he feels as though the weakness is getting worse and would like to be referred to neurology for evaluation.   Past Medical History:  Diagnosis Date   Coronary artery disease    Hypercholesteremia    Hypertension    NSTEMI (non-ST elevated myocardial infarction) [facility])     Past Surgical History:  Procedure Laterality Date   CERVICAL  LAMINECTOMY     LUMBAR SPINE SURGERY      Current Outpatient Medications  Medication Sig Dispense Refill   aspirin EC 81 MG tablet Take 81 mg by mouth daily.     nitroGLYCERIN (NITROSTAT) 0.4 MG SL tablet Place 1 tablet (0.4 mg total) under the tongue every 5 (five) minutes as needed for chest pain. 25 tablet 3   metoprolol succinate (TOPROL-XL) 25 MG 24 hr tablet Take 1 tablet (25 mg total) by mouth daily. 90 tablet 2   pravastatin (PRAVACHOL) 40 MG tablet Take 1 tablet (40 mg total) by mouth daily. 90 tablet 3   No current facility-administered medications for this visit.   Allergies:  Penicillins   Social History: The patient  reports that he has never smoked. He has never used smokeless tobacco. He reports that he does not drink alcohol and does not use drugs.   Family History: The patient's family history includes Alzheimer's disease in his mother; Aneurysm in his father; COPD in his sister.   ROS:  Please see the history of present illness. Otherwise, complete review of systems is positive for none.  All other systems are reviewed and negative.   Physical Exam: VS:  BP 134/80   Pulse 72   Ht '5\' 9"'$  (1.753 m)   Wt 170 lb 3.2 oz (77.2 kg)   SpO2 96%   BMI 25.13 kg/m , BMI Body mass index is 25.13 kg/m.  Wt Readings from Last 3 Encounters:  11/09/20 170 lb  3.2 oz (77.2 kg)  08/29/19 177 lb (80.3 kg)  06/12/18 174 lb (78.9 kg)    General: Patient appears comfortable at rest. Neck: Supple, no elevated JVP or carotid bruits, no thyromegaly. Lungs: Clear to auscultation, nonlabored breathing at rest. Cardiac: Regular rate and rhythm, no S3 or significant systolic murmur, no pericardial rub. Extremities: No pitting edema, distal pulses 2+. Skin: Warm and dry. Musculoskeletal: No kyphosis. Neuropsychiatric: Alert and oriented x3, affect grossly appropriate.  ECG: 11/09/2020 EKG normal sinus rhythm rate of 69, LVH with repolarization abnormality.  Recent Labwork: No results  found for requested labs within last 8760 hours.  No results found for: CHOL, TRIG, HDL, CHOLHDL, VLDL, LDLCALC, LDLDIRECT  Other Studies Reviewed Today:   Assessment and Plan:  1. CAD in native artery   2. Essential hypertension   3. Mixed hyperlipidemia   4. Frequent falls   5. Weakness    1. CAD in native artery Currently denies any anginal or exertional symptoms or nitroglycerin use.  Continue aspirin 81 mg daily.  Continue sublingual nitroglycerin as needed.  2. Essential hypertension Blood pressure is reasonably controlled today at 134/80.  Patient states BP is usually better at home.  Continue Toprol-XL 25 mg daily.  3. Mixed hyperlipidemia Continue pravastatin 40 mg daily.  4. Frequent falls Patient states he is having issues with frequent falls due to muscle weakness.  He has a history of hereditary spastic paraplegia.  He has not seen a neurologist in quite some time  5. Weakness in legs Complaining of progressive leg weakness with increased falls as noted above.  He states he feels as though the weakness has progressed.  He would like to be referred to a local neurologist.  Please refer to Dr. Merlene Laughter at [facility] neurology.  Medication Adjustments/Labs and Tests Ordered: Current medicines are reviewed at length with the patient today.  Concerns regarding medicines are outlined above.   Disposition: Follow-up with Dr. Domenic Polite or APP 1 year  Signed, Levell July, NP 11/09/2020 12:23 PM    [facility], [place], [place] [address] Phone: [phone]; Fax: [phone]

## 2020-11-09 ENCOUNTER — Ambulatory Visit (INDEPENDENT_AMBULATORY_CARE_PROVIDER_SITE_OTHER): Payer: Medicare Other | Admitting: Family Medicine

## 2020-11-09 ENCOUNTER — Encounter: Payer: Self-pay | Admitting: Family Medicine

## 2020-11-09 VITALS — BP 134/80 | HR 72 | Ht 69.0 in | Wt 170.2 lb

## 2020-11-09 DIAGNOSIS — I2510 Atherosclerotic heart disease of native coronary artery without angina pectoris: Secondary | ICD-10-CM

## 2020-11-09 DIAGNOSIS — R296 Repeated falls: Secondary | ICD-10-CM

## 2020-11-09 DIAGNOSIS — I10 Essential (primary) hypertension: Secondary | ICD-10-CM | POA: Diagnosis not present

## 2020-11-09 DIAGNOSIS — R531 Weakness: Secondary | ICD-10-CM | POA: Diagnosis not present

## 2020-11-09 DIAGNOSIS — E782 Mixed hyperlipidemia: Secondary | ICD-10-CM

## 2020-11-09 MED ORDER — METOPROLOL SUCCINATE ER 25 MG PO TB24
25.0000 mg | ORAL_TABLET | Freq: Every day | ORAL | 2 refills | Status: DC
Start: 2020-11-09 — End: 2022-01-30

## 2020-11-09 MED ORDER — PRAVASTATIN SODIUM 40 MG PO TABS: 40.0000 mg | ORAL_TABLET | Freq: Every day | ORAL | 3 refills | Status: DC

## 2020-11-09 NOTE — Patient Instructions (Addendum)
Medication Instructions:  Your physician recommends that you continue on your current medications as directed. Please refer to the Current Medication list given to you today.  Labwork: none  Testing/Procedures: none  Follow-Up: Your physician recommends that you schedule a follow-up appointment in: 1 year. You will receive a reminder call or letter in the mail in about 10 months reminding you to call and schedule your appointment. If you don't receive this letter, please contact our office.  Any Other Special Instructions Will Be Listed Below (If Applicable). You have been referred to Dr. Merlene Laughter  If you need a refill on your cardiac medications before your next appointment, please call your pharmacy.

## 2020-11-18 ENCOUNTER — Other Ambulatory Visit: Payer: Self-pay | Admitting: Neurology

## 2020-11-18 ENCOUNTER — Other Ambulatory Visit (HOSPITAL_COMMUNITY): Payer: Self-pay | Admitting: Neurology

## 2020-11-18 DIAGNOSIS — I10 Essential (primary) hypertension: Secondary | ICD-10-CM | POA: Diagnosis not present

## 2020-11-18 DIAGNOSIS — R296 Repeated falls: Secondary | ICD-10-CM | POA: Diagnosis not present

## 2020-11-18 DIAGNOSIS — G8220 Paraplegia, unspecified: Secondary | ICD-10-CM | POA: Diagnosis not present

## 2020-11-18 DIAGNOSIS — G629 Polyneuropathy, unspecified: Secondary | ICD-10-CM | POA: Diagnosis not present

## 2020-11-18 DIAGNOSIS — I2510 Atherosclerotic heart disease of native coronary artery without angina pectoris: Secondary | ICD-10-CM | POA: Diagnosis not present

## 2020-11-18 DIAGNOSIS — E785 Hyperlipidemia, unspecified: Secondary | ICD-10-CM | POA: Diagnosis not present

## 2020-12-02 ENCOUNTER — Ambulatory Visit (HOSPITAL_COMMUNITY)
Admission: RE | Admit: 2020-12-02 | Discharge: 2020-12-02 | Disposition: A | Discharge status: Home / Self Care | Payer: Medicare Other | Source: Ambulatory Visit | Attending: Neurology | Admitting: Neurology

## 2020-12-02 ENCOUNTER — Other Ambulatory Visit: Payer: Self-pay

## 2020-12-02 DIAGNOSIS — R296 Repeated falls: Secondary | ICD-10-CM | POA: Insufficient documentation

## 2020-12-02 DIAGNOSIS — M542 Cervicalgia: Secondary | ICD-10-CM | POA: Diagnosis not present

## 2020-12-02 IMAGING — MR MR CERVICAL SPINE W/O CM
5 series · 36 of 48 positions shown · non-contrast
Comparison: None.

CLINICAL DATA: 75-year-old male with posterior neck pain radiating
through the spine. Difficulty walking. Multiple recent falls.

EXAM:
MRI CERVICAL SPINE WITHOUT CONTRAST
TECHNIQUE: Multiplanar, multisequence MR imaging of the cervical spine was
performed. No intravenous contrast was administered.

[Series 5: T2 · sagittal · 3.0mm · 0.69mm/px · 6 of 13 slices shown (1 of 2)]
[im 1/13]
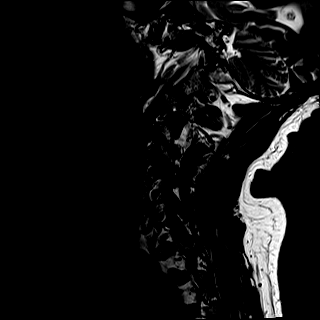
[im 3/13]
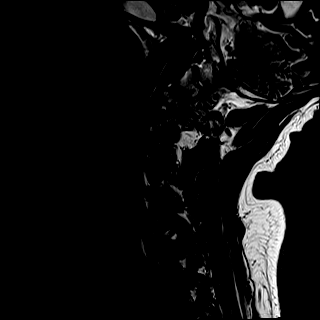
[im 5/13]
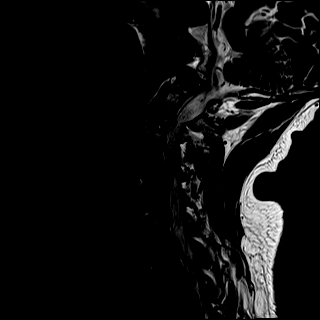
[im 8/13]
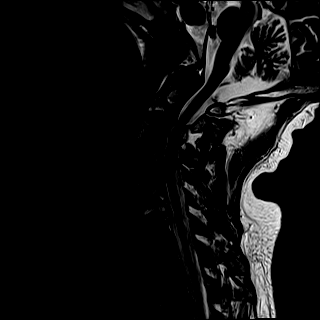
[im 10/13]
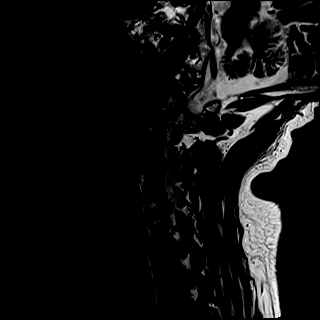
[im 13/13]
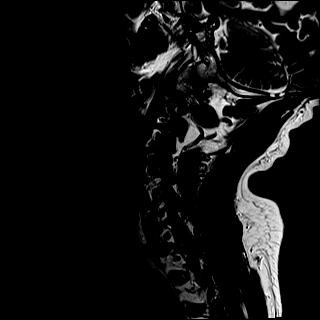

[Series 6: T1 · sagittal · 3.0mm · 0.86mm/px · 6 of 13 slices shown]
[im 1/13]
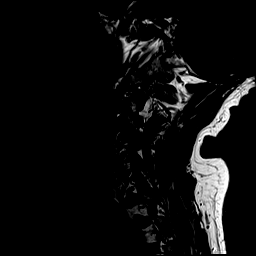
[im 3/13]
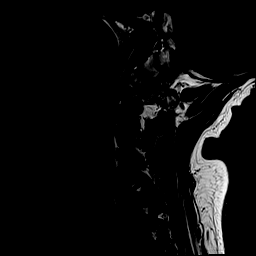
[im 5/13]
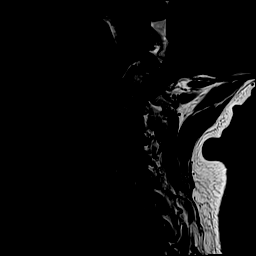
[im 8/13]
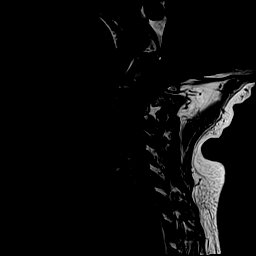
[im 10/13]
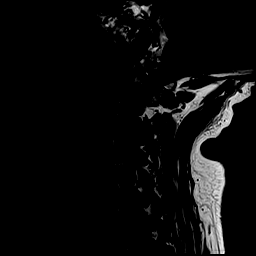
[im 13/13]
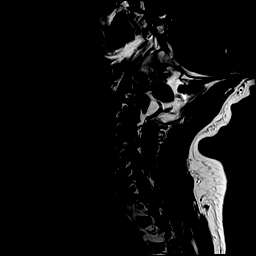

[Series 7: STIR · sagittal · 3.0mm · 0.69mm/px · 6 of 13 slices shown]
[im 1/13]
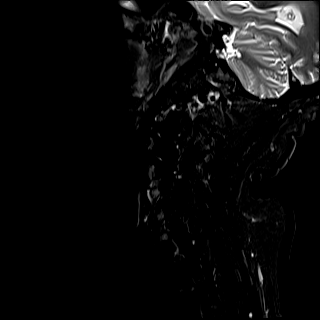
[im 3/13]
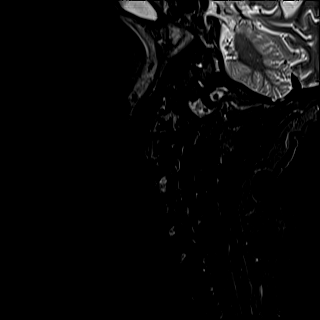
[im 5/13]
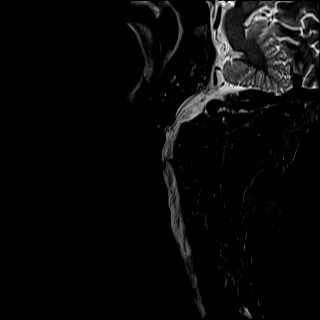
[im 8/13]
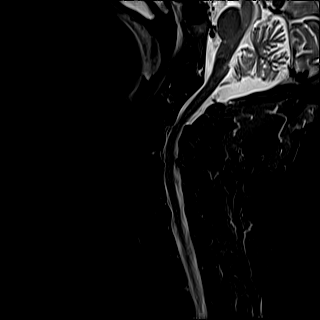
[im 10/13]
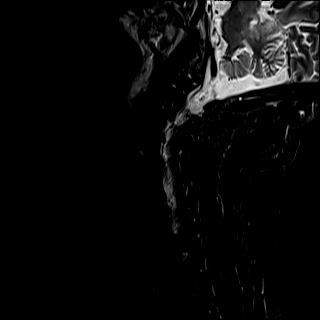
[im 13/13]
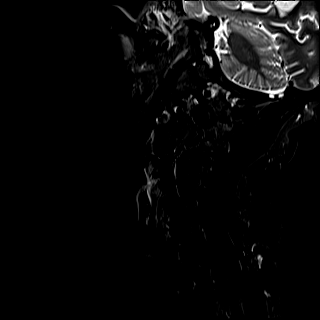

[Series 8: T2 · axial · 3.0mm · 0.70mm/px · z∈[-134,-36]mm · 10 of 32 slices shown (2 of 2)]
[im 1/32]
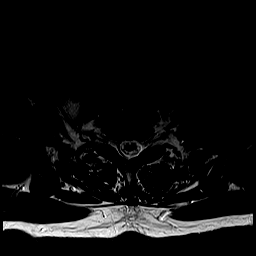
[im 3/32]
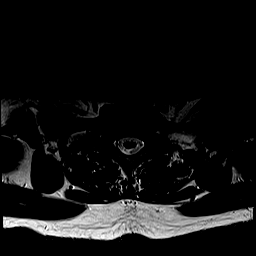
[im 5/32]
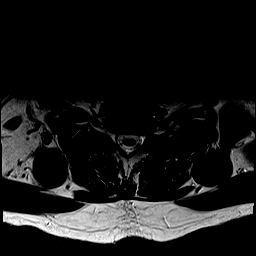
[im 9/32]
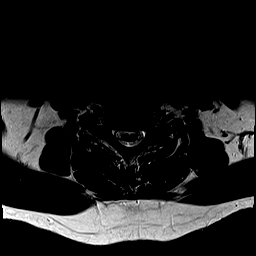
[im 14/32]
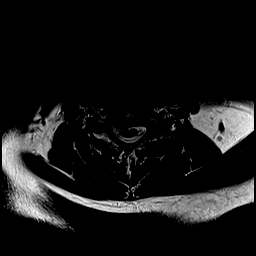
[im 16/32]
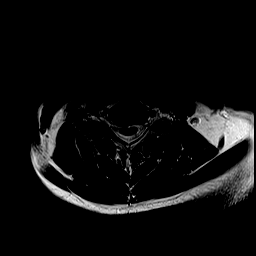
[im 18/32]
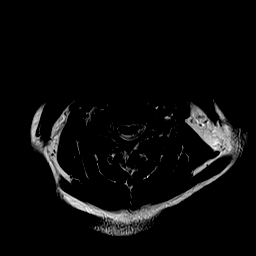
[im 23/32]
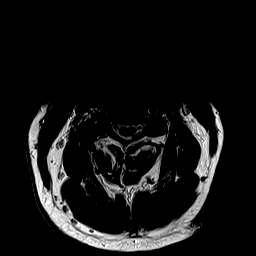
[im 27/32]
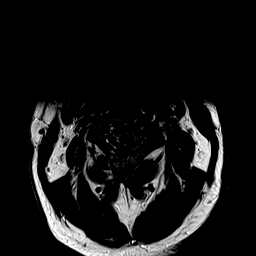
[im 32/32]
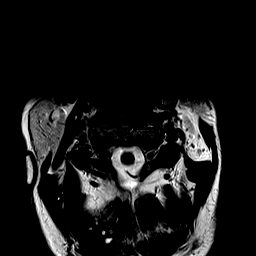

[Series 9: GRE · axial · 3.0mm · 0.35mm/px · z∈[-134,-36]mm · 8 of 32 slices shown]
[im 1/32]
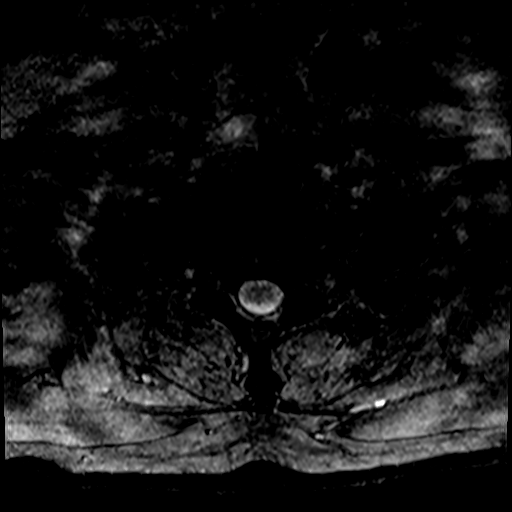
[im 5/32]
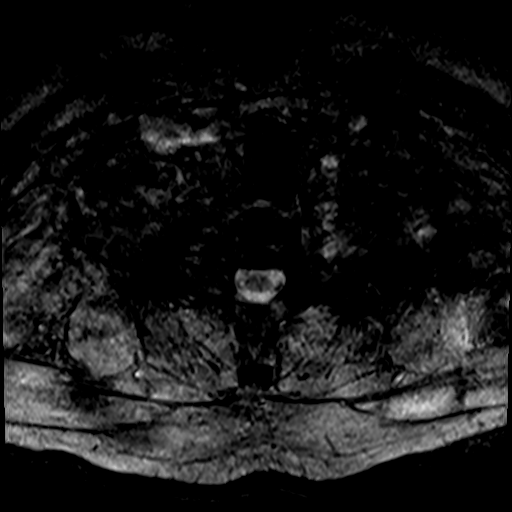
[im 9/32]
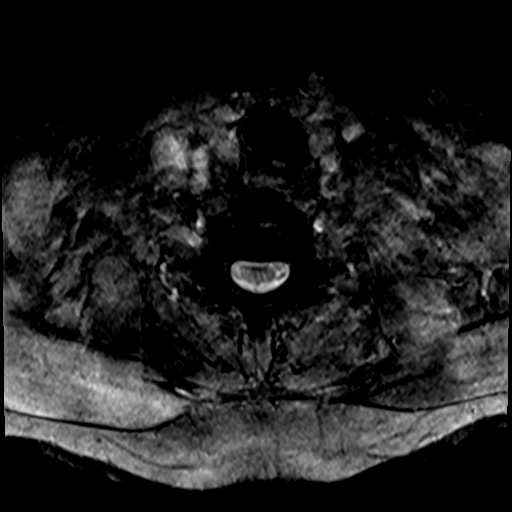
[im 14/32]
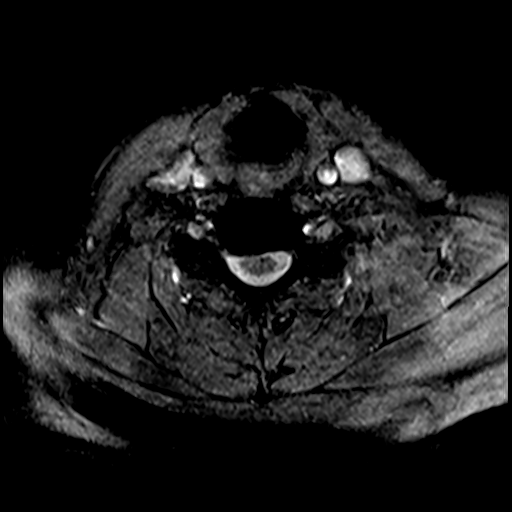
[im 18/32]
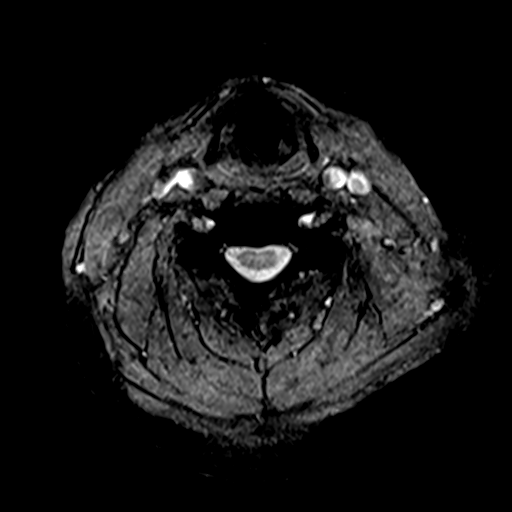
[im 23/32]
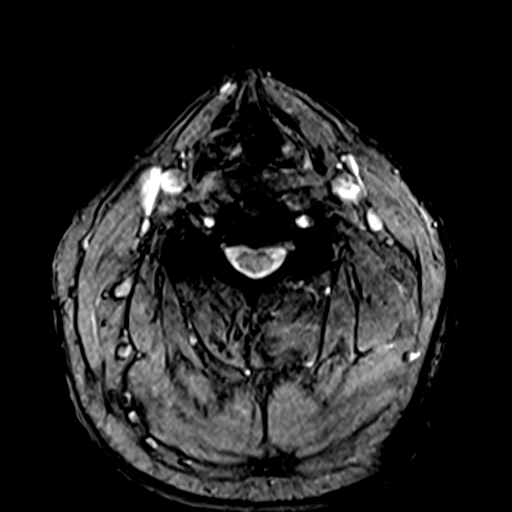
[im 27/32]
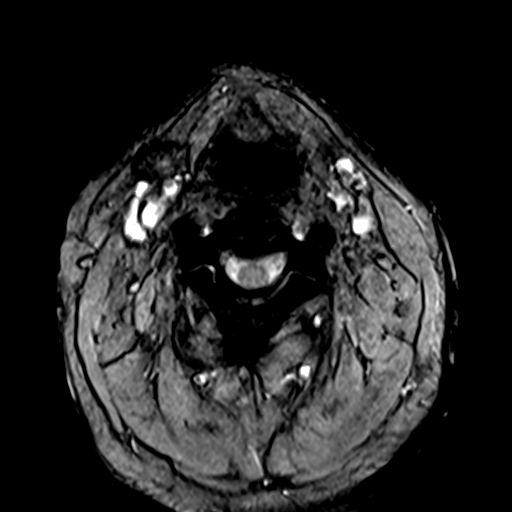
[im 32/32]
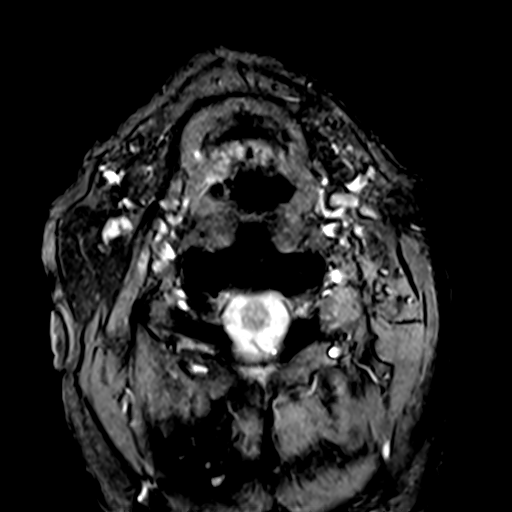

[36 of 48 positions shown; findings below may reference images not displayed]

FINDINGS: Alignment: Straightening of lower cervical lordosis and mildly
exaggerated upper cervical lordosis. Cervicothoracic junction
alignment is within normal limits.

Vertebrae: No marrow edema or evidence of acute osseous abnormality.
Normal background bone marrow signal. Chronic endplate degenerative
marrow signal changes at C6-C7, also C2-C3.

Chronic interbody ankylosis at C5-C6, appears acquired rather than
congenital.

Cord: No cervical spinal cord signal abnormality despite some
degenerative cord mass effect detailed below. Below C4-C5 the
visible spinal canal is capacious.

Posterior Fossa, vertebral arteries, paraspinal tissues:
Cervicomedullary junction is within normal limits. Grossly negative
visible brain parenchyma, posterior fossa. Preserved major vascular
flow voids in the neck, with dominant appearing left vertebral
artery. Negative visible neck soft tissues and lung apices.

Disc levels:

C2-C3: Subtle retrolisthesis with disc desiccation. Circumferential
disc bulge and endplate spurring with broad-based posterior
component. Mild to moderate ligament flavum and facet hypertrophy.
Mild spinal stenosis. Mild if any cord mass effect. Mild bilateral
C3 foraminal stenosis.

C3-C4: Disc desiccation and disc bulging. Mild endplate spurring.
Mild-to-moderate facet and right side ligament flavum hypertrophy.
No significant spinal stenosis. Moderate left and moderate to severe
right C4 neural foraminal stenosis.

C4-C5: Disc desiccation. Circumferential disc bulge and small
superimposed right paracentral disc protrusion with annular fissure.
Mild facet and ligament flavum hypertrophy. Mild spinal stenosis and
ventral cord mass effect. Severe left and moderate to severe right
C5 foraminal stenosis.

C5-C6:  Interbody ankylosis.  No stenosis.

C6-C7: Disc desiccation and disc space loss. Circumferential disc
osteophyte complex. Broad-based posterior component with no spinal
stenosis. Moderate to severe bilateral C7 foraminal stenosis.

C7-T1:  Negative.

No visible upper thoracic spinal stenosis.
IMPRESSION: 1. Chronic interbody ankylosis at C5-C6.
2. Adjacent segment disease at both C4-C5 and C6-C7. Mild spinal
stenosis and mild cord mass effect at the former. Moderate to severe
bilateral C5 and C7 neural foraminal stenosis.
3. Advanced degeneration also at C2-C3 with mild spinal stenosis.
Mild if any cord mass effect.
4. No cervical cord signal abnormality.

## 2020-12-07 ENCOUNTER — Ambulatory Visit: Payer: Medicare Other | Admitting: Orthopaedic Surgery

## 2020-12-14 ENCOUNTER — Other Ambulatory Visit: Payer: Self-pay

## 2020-12-14 ENCOUNTER — Ambulatory Visit: Payer: Medicare Other

## 2020-12-14 ENCOUNTER — Ambulatory Visit (INDEPENDENT_AMBULATORY_CARE_PROVIDER_SITE_OTHER): Payer: Medicare Other | Admitting: Orthopaedic Surgery

## 2020-12-14 ENCOUNTER — Encounter: Payer: Self-pay | Admitting: Orthopaedic Surgery

## 2020-12-14 VITALS — BP 156/79 | HR 73 | Ht 69.0 in | Wt 163.0 lb

## 2020-12-14 DIAGNOSIS — I2510 Atherosclerotic heart disease of native coronary artery without angina pectoris: Secondary | ICD-10-CM

## 2020-12-14 DIAGNOSIS — G114 Hereditary spastic paraplegia: Secondary | ICD-10-CM | POA: Diagnosis not present

## 2020-12-14 DIAGNOSIS — M545 Low back pain, unspecified: Secondary | ICD-10-CM

## 2020-12-14 DIAGNOSIS — M25551 Pain in right hip: Secondary | ICD-10-CM

## 2020-12-14 NOTE — Progress Notes (Signed)
Subjective:    Patient ID: Barry Powers, male    DOB: 1945/12/05, 75 y.o.   MRN: [id number]  HPI My back and right leg don't work right.  He had a fall in late June or early July and has been having pain in the lower back and right hip since then.  He is gradually getting worse.  He says he loses control of his leg on the right at times.  He has spasm of the right lateral thigh at times.  He falls often.  This has been getting worse recently.  He has seen Dr. Merlene Laughter for this.  I have copies of his notes.  He had MRI of the cervical spine done.  MRI showed: IMPRESSION: 1. 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 2. 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 Mild spinal stenosis and mild cord mass effect at the former. Moderate to severe bilateral C5 and C7 neural foraminal stenosis. 3. Advanced degeneration also at C2-C3 with mild spinal stenosis. Mild if any cord mass effect. 4. No cervical cord signal abnormality.    He also has Hereditary spastic paraplegia, found out years ago at [facility].  He was told of it getting worse as he aged. He did not tell me of this, but his wife did when he went to the bathroom.  Dr. Merlene Laughter has also listed it in his notes.  He is concerned his leg weakness and back pain are not improving.  He has no other extremity problem.   Review of Systems  Constitutional:  Positive for activity change.  Musculoskeletal:  Positive for arthralgias, back pain, gait problem and myalgias.  Neurological:  Positive for weakness.       Hereditary Spastic Paraplegia  All other systems reviewed and are negative. For Review of Systems, all other systems reviewed and are negative.  The following is a summary of the past history medically, past history surgically, known current medicines, social history and family history.  This information is gathered electronically by the computer from prior information and documentation.  I review this each visit and have found  including this information at this point in the chart is beneficial and informative.   Past Medical History:  Diagnosis Date   Coronary artery disease    Hypercholesteremia    Hypertension    NSTEMI (non-ST elevated myocardial infarction) [facility])     Past Surgical History:  Procedure Laterality Date   CERVICAL LAMINECTOMY     LUMBAR SPINE SURGERY      Current Outpatient Medications on File Prior to Visit  Medication Sig Dispense Refill   aspirin EC 81 MG tablet Take 81 mg by mouth daily.     metoprolol succinate (TOPROL-XL) 25 MG 24 hr tablet Take 1 tablet (25 mg total) by mouth daily. 90 tablet 2   nitroGLYCERIN (NITROSTAT) 0.4 MG SL tablet Place 1 tablet (0.4 mg total) under the tongue every 5 (five) minutes as needed for chest pain. 25 tablet 3   pravastatin (PRAVACHOL) 40 MG tablet Take 1 tablet (40 mg total) by mouth daily. 90 tablet 3   No current facility-administered medications on file prior to visit.    Social History   Socioeconomic History   Marital status: Married    Spouse name: Not on file   Number of children: Not on file   Years of education: Not on file   Highest education level: Not on file  Occupational History   Not on file  Tobacco Use  Smoking status: Never   Smokeless tobacco: Never  Vaping Use   Vaping Use: Never used  Substance and Sexual Activity   Alcohol use: No   Drug use: No   Sexual activity: Not on file  Other Topics Concern   Not on file  Social History Narrative   Not on file   Social Determinants of Health   Financial Resource Strain: Not on file  Food Insecurity: Not on file  Transportation Needs: Not on file  Physical Activity: Not on file  Stress: Not on file  Social Connections: Not on file  Intimate Partner Violence: Not on file    Family History  Problem Relation Age of Onset   Alzheimer's disease Mother    Aneurysm Father    COPD Sister     BP (!) 156/79   Pulse 73   Ht '5\' 9"'$  (1.753 m)   Wt 163 lb (73.9  kg)   BMI 24.07 kg/m   Body mass index is 24.07 kg/m.     Objective:   Physical Exam Vitals and nursing note reviewed. Exam conducted with a chaperone present.  Constitutional:      Appearance: He is well-developed.  HENT:     Head: Normocephalic and atraumatic.  Eyes:     Conjunctiva/sclera: Conjunctivae normal.     Pupils: Pupils are equal, round, and reactive to light.  Cardiovascular:     Rate and Rhythm: Normal rate and regular rhythm.  Pulmonary:     Effort: Pulmonary effort is normal.  Abdominal:     Palpations: Abdomen is soft.  Musculoskeletal:       Arms:     Cervical back: Normal range of motion and neck supple.  Skin:    General: Skin is warm and dry.  Neurological:     Mental Status: He is alert and oriented to person, place, and time.     Cranial Nerves: No cranial nerve deficit.     Motor: Weakness present. No abnormal muscle tone.     Coordination: Coordination abnormal.     Gait: Gait abnormal.     Deep Tendon Reflexes: Reflexes abnormal.     Comments: He has fibrillations of the lateral quad muscle on the right.  He has weakness of the right leg unable to lift his leg to cross to the left leg.  He has weakness on the right leg when standing and loses ability to stand after 15 seconds, and falls.  ROM of the hips is good.  Reflexes are decreased left and right.  Sensation intact.  He has some lower back pain on the right.  Main problem is weakness.  Psychiatric:        Behavior: Behavior normal.        Thought Content: Thought content normal.        Judgment: Judgment normal.  X-rays were done of the pelvis and right hip and lower back, reported separately.        Assessment & Plan:   Encounter Diagnoses  Name Primary?   Pain in right hip Yes   Acute midline low back pain without sciatica    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[place])    I will get MRI of the lumbar spine and pelvic area.  He has osteophytes of the lateral pelvis.  His main problem  may be related to the hereditary spastic paraplegia but I want to make sure there is no defect in lumbar spine or pelvis first.  He is to use walker.  He  is to limit walking.  Return in two weeks.  Call if any problem.  Precautions discussed.  Electronically Signed Sanjuana Kava, MD 9/6/202211:34 AM

## 2020-12-29 ENCOUNTER — Ambulatory Visit (HOSPITAL_COMMUNITY)
Admission: RE | Admit: 2020-12-29 | Discharge: 2020-12-29 | Disposition: A | Discharge status: Home / Self Care | Payer: Medicare Other | Source: Ambulatory Visit | Attending: Orthopaedic Surgery | Admitting: Orthopaedic Surgery

## 2020-12-29 ENCOUNTER — Other Ambulatory Visit: Payer: Self-pay

## 2020-12-29 DIAGNOSIS — M25551 Pain in right hip: Secondary | ICD-10-CM | POA: Diagnosis not present

## 2020-12-29 DIAGNOSIS — M48061 Spinal stenosis, lumbar region without neurogenic claudication: Secondary | ICD-10-CM | POA: Diagnosis not present

## 2020-12-29 DIAGNOSIS — M25552 Pain in left hip: Secondary | ICD-10-CM | POA: Diagnosis not present

## 2020-12-29 DIAGNOSIS — M545 Low back pain, unspecified: Secondary | ICD-10-CM | POA: Diagnosis not present

## 2020-12-29 DIAGNOSIS — M47816 Spondylosis without myelopathy or radiculopathy, lumbar region: Secondary | ICD-10-CM | POA: Diagnosis not present

## 2020-12-29 DIAGNOSIS — K4090 Unilateral inguinal hernia, without obstruction or gangrene, not specified as recurrent: Secondary | ICD-10-CM | POA: Diagnosis not present

## 2020-12-29 IMAGING — MR MR LUMBAR SPINE W/O CM
6 of 7 series · 35 of 48 positions shown · non-contrast
Comparison: Radiographs [DATE]

CLINICAL DATA: Acute midline low back pain without sciatica.

EXAM:
MRI LUMBAR SPINE WITHOUT CONTRAST
TECHNIQUE: Multiplanar, multisequence MR imaging of the lumbar spine was
performed. No intravenous contrast was administered.

[Series 7: T2 · sagittal · 4.0mm · 0.68mm/px · 5 of 15 slices shown (1 of 3)]
[im 1/15]
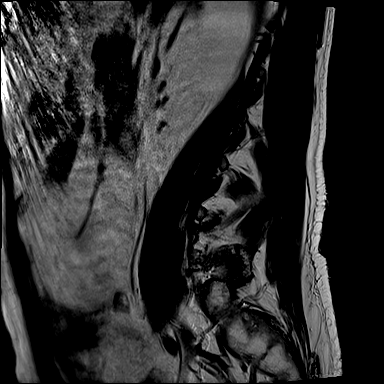
[im 4/15]
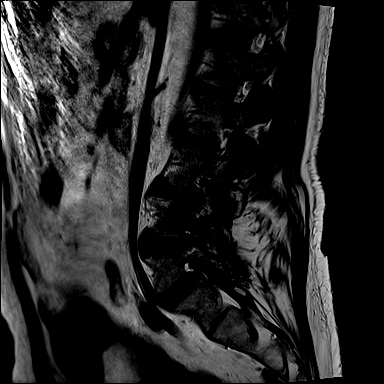
[im 8/15]
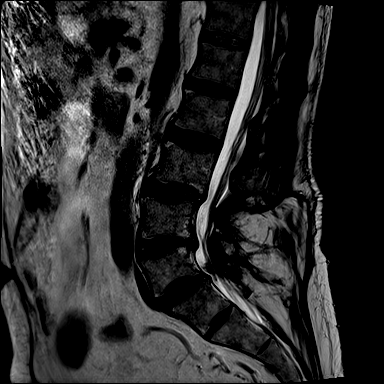
[im 11/15]
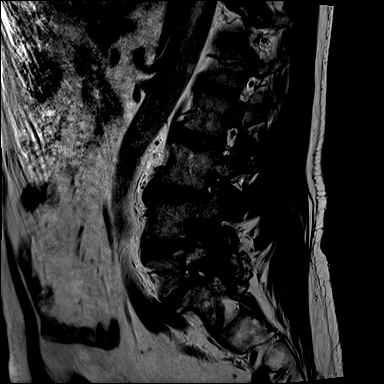
[im 15/15]
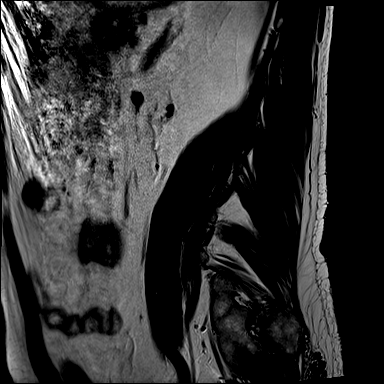

[Series 8: T1 · sagittal · 4.0mm · 0.81mm/px · 5 of 15 slices shown (1 of 2)]
[im 1/15]
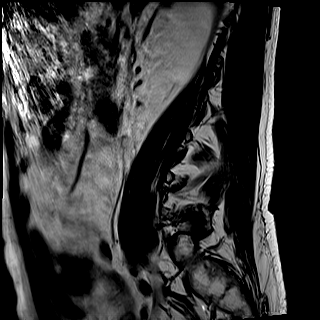
[im 4/15]
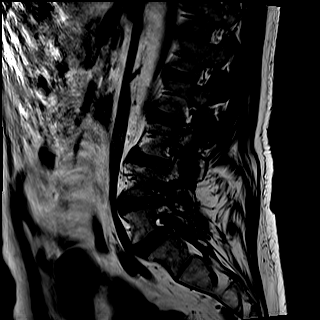
[im 8/15]
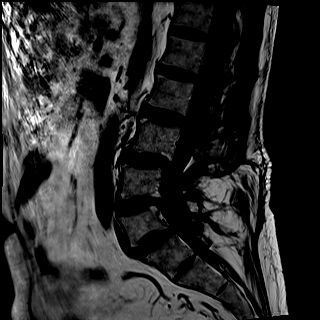
[im 11/15]
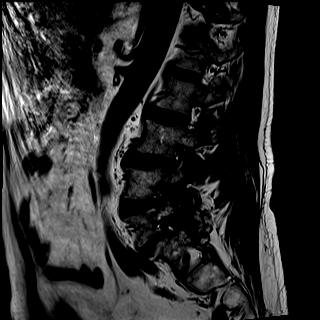
[im 15/15]
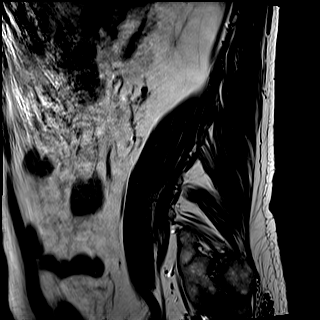

[Series 9: STIR · sagittal · 4.0mm · 0.51mm/px · 1 of 15 slices shown]
[im 1/15]
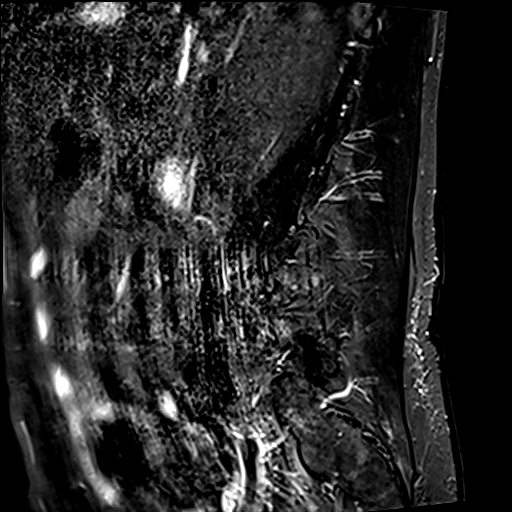

[Series 10: T2 · axial · 4.0mm · 0.70mm/px · z∈[-96,+93]mm · 8 of 34 slices shown (2 of 3)]
[im 1/34]
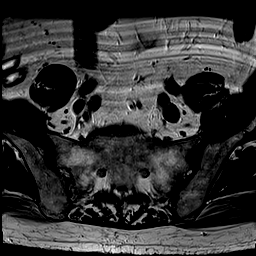
[im 4/34]
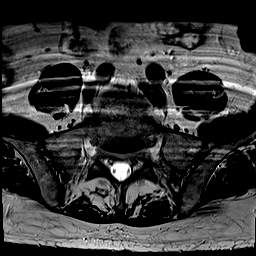
[im 12/34]
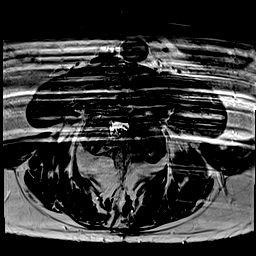
[im 15/34]
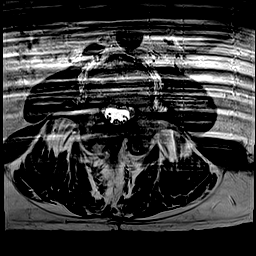
[im 19/34]
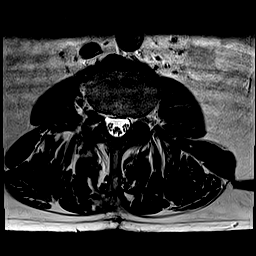
[im 23/34]
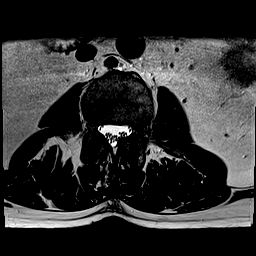
[im 30/34]
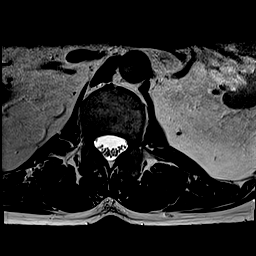
[im 34/34]
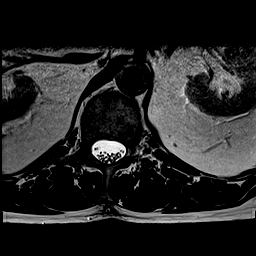

[Series 11: T1 · axial · 4.0mm · 0.35mm/px · z∈[-96,+93]mm · 8 of 34 slices shown (2 of 2)]
[im 1/34]
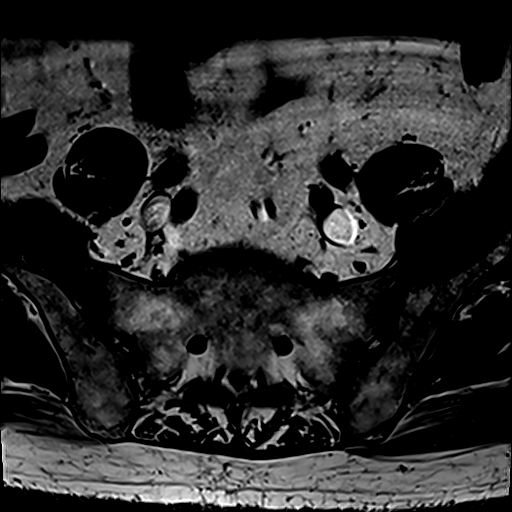
[im 4/34]
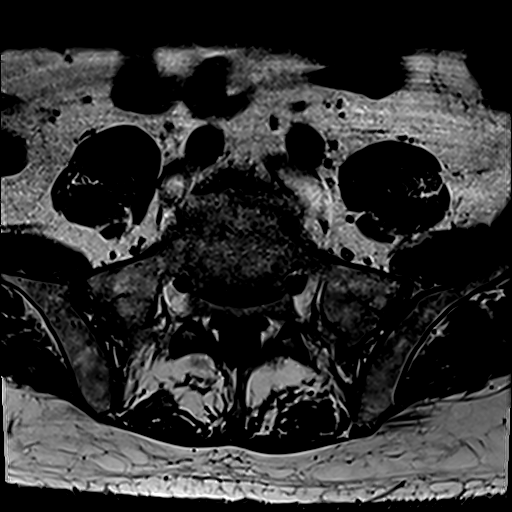
[im 12/34]
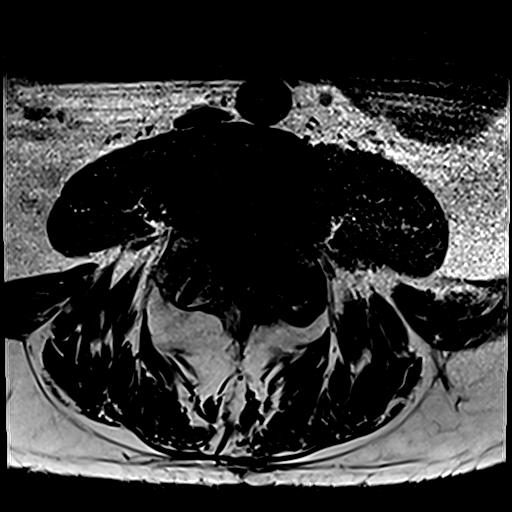
[im 15/34]
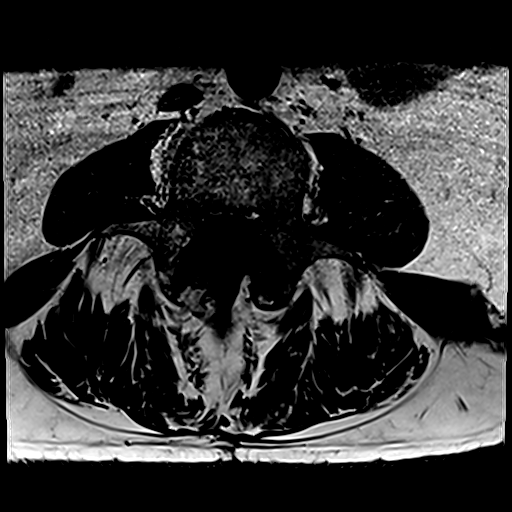
[im 19/34]
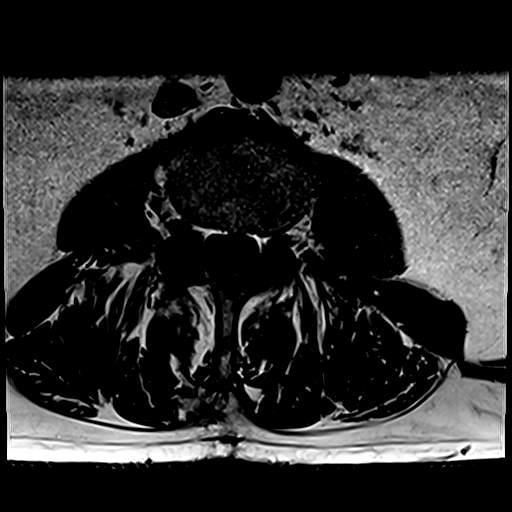
[im 23/34]
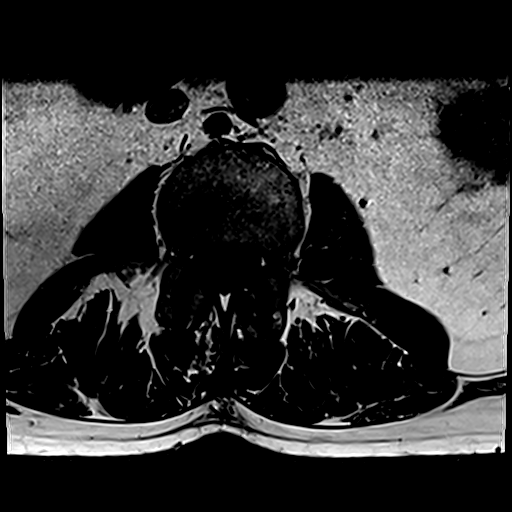
[im 30/34]
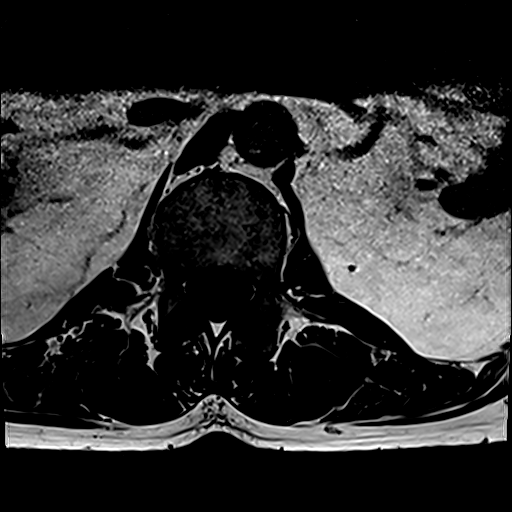
[im 34/34]
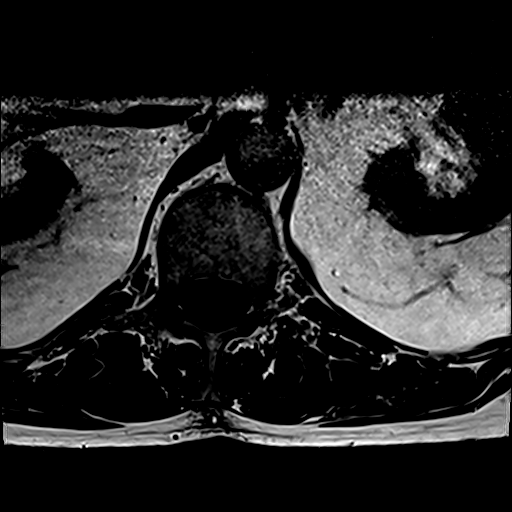

[Series 12: T2 · axial · 4.0mm · 0.70mm/px · z∈[-96,+93]mm · 8 of 34 slices shown (3 of 3)]
[im 1/34]
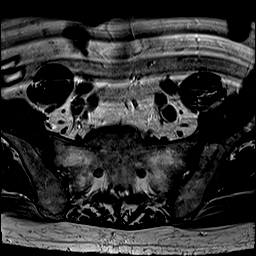
[im 4/34]
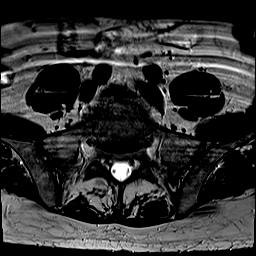
[im 12/34]
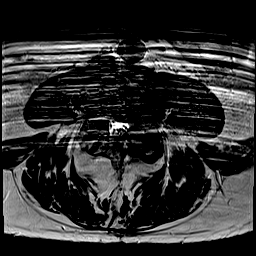
[im 15/34]
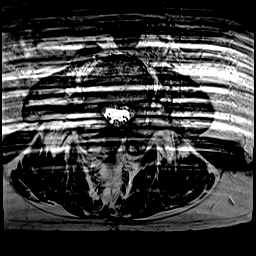
[im 19/34]
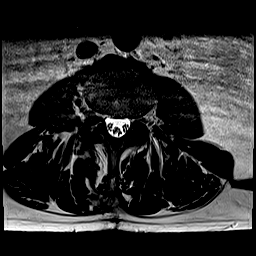
[im 23/34]
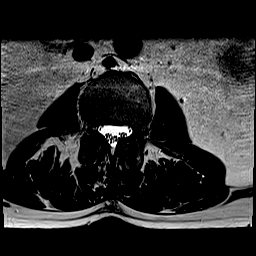
[im 30/34]
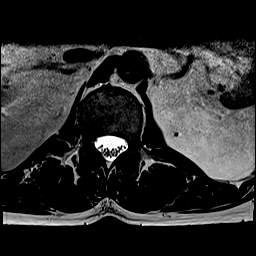
[im 34/34]
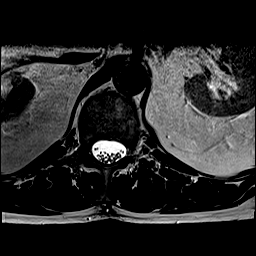

[35 of 48 positions shown; findings below may reference images not displayed]

FINDINGS: Segmentation:  Standard.

Alignment: Grade 1 anterolisthesis of L5 over S1. Small
retrolisthesis of L3 over L4 and L4 over L5.

Vertebrae:  No fracture, evidence of discitis, or bone lesion.

Conus medullaris and cauda equina: Conus extends to the L1 level.
Conus and cauda equina appear normal.

Paraspinal and other soft tissues: Negative.

Disc levels:

T12-L1: No spinal canal or neural foraminal stenosis.

L1-2: No spinal canal or neural stenosis.

L2-3 moderate facet degenerative changes. No significant spinal
canal or neural foraminal stenosis.

L3-4: Disc bulge, prominent hypertrophic facet degenerative changes,
right greater than, resulting in mild spinal canal stenosis with
narrowing of the subarticular zones, severe right and moderate left
neural foraminal narrowing.

L4-5: Evaluation of the axial plane is degraded by motion.
Postsurgical changes from prior laminectomy. Retrolisthesis and disc
bulge with prominent hypertrophic facet degenerative changes
resulting in severe bilateral neural foraminal narrowing. No
significant spinal canal stenosis.

L5-S1: Evaluation of the axial plane is degraded by motion. Disc
bulge and prominent hypertrophic facet degenerative changes
resulting in mild spinal canal stenosis with narrowing of the
bilateral subarticular zones and moderate to severe bilateral neural
foraminal narrowing.
IMPRESSION: 1. Degenerative changes of the lower lumbar spine resulting in
high-grade bilateral neural foraminal narrowing at L3-4, L4-5 and
L5-S1.
2. Mild spinal canal stenosis at L3-4 and L5-S1.

## 2020-12-29 IMAGING — MR MR PELVIS W/O CM
5 series · 35 of 48 positions shown · non-contrast
Comparison: [DATE]

CLINICAL DATA: Low back and bilateral hip pain

EXAM:
MRI PELVIS WITHOUT CONTRAST
TECHNIQUE: Multiplanar multisequence MR imaging of the pelvis was performed. No
intravenous contrast was administered.

[Series 7: T2 fat-sat · sagittal · 4.0mm · 0.94mm/px · 8 of 72 slices shown (1 of 2)]
[im 1/72]
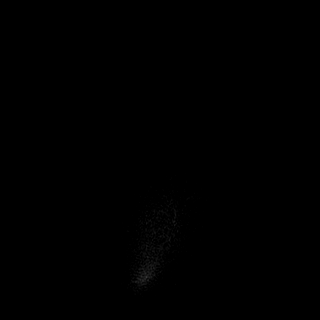
[im 11/72]
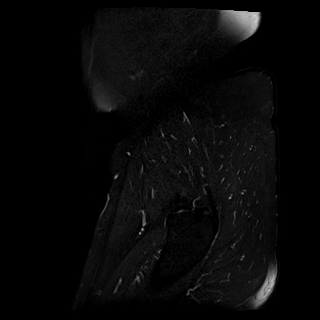
[im 22/72]
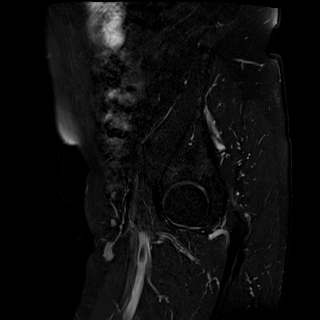
[im 33/72]
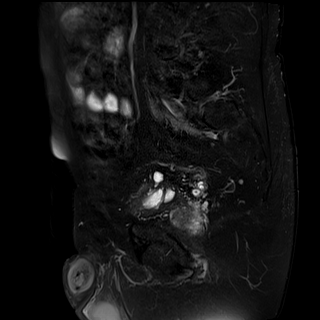
[im 39/72]
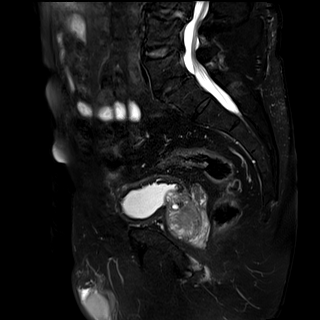
[im 50/72]
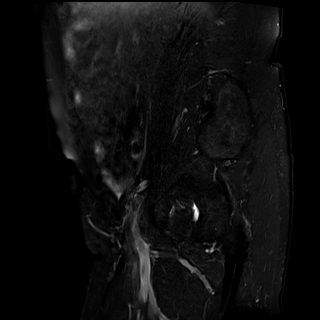
[im 61/72]
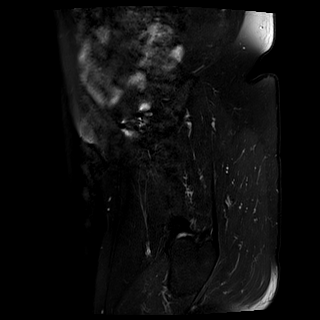
[im 72/72]
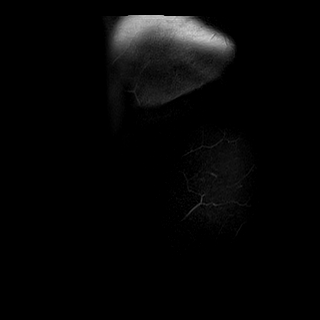

[Series 8: T1 · coronal · 4.0mm · 1.25mm/px · 7 of 42 slices shown (1 of 2)]
[im 1/42]
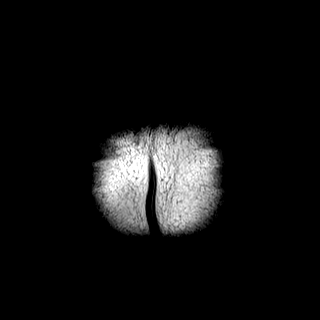
[im 7/42]
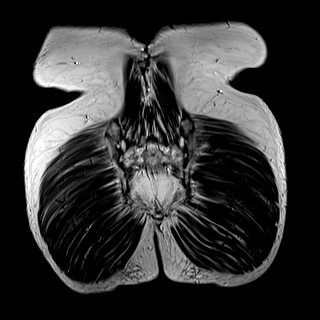
[im 14/42]
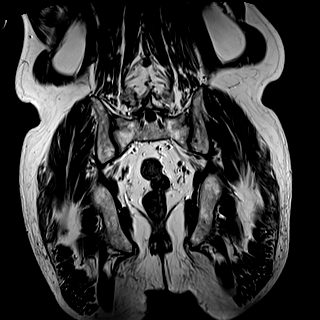
[im 21/42]
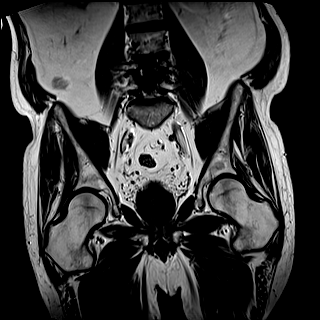
[im 28/42]
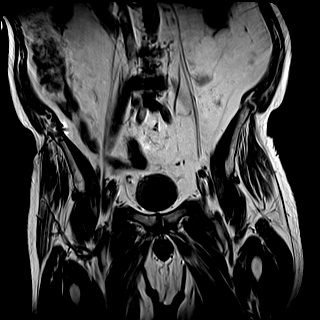
[im 35/42]
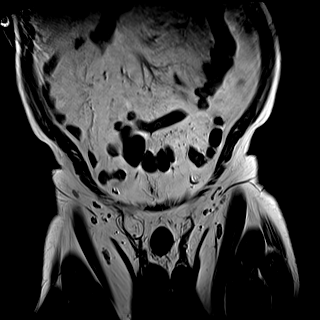
[im 42/42]
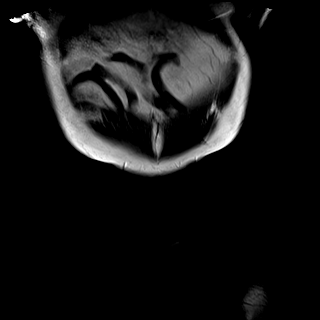

[Series 9: STIR · coronal · 4.0mm · 1.25mm/px · 7 of 42 slices shown]
[im 1/42]
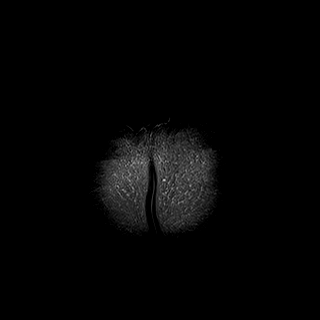
[im 7/42]
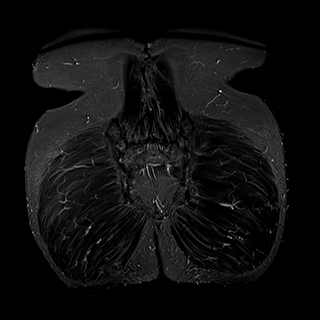
[im 14/42]
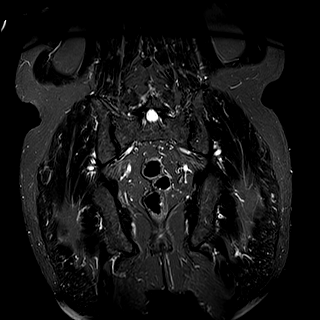
[im 21/42]
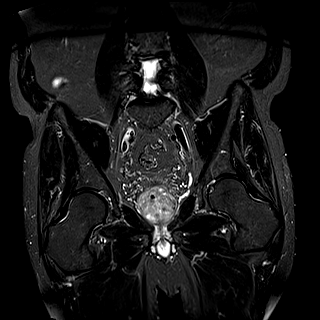
[im 28/42]
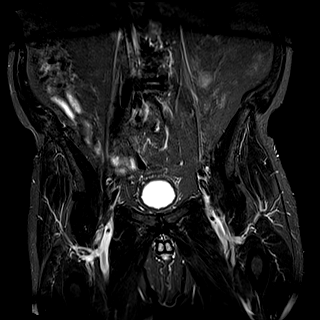
[im 35/42]
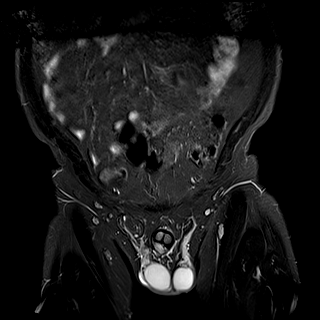
[im 42/42]
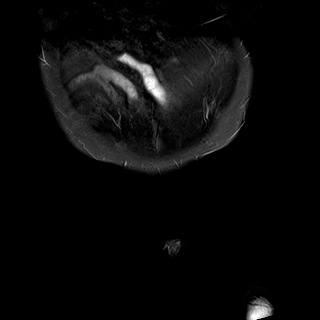

[Series 10: T1 · axial · 4.0mm · 0.78mm/px · z∈[-323,-28]mm · 8 of 60 slices shown (2 of 2)]
[im 1/60]
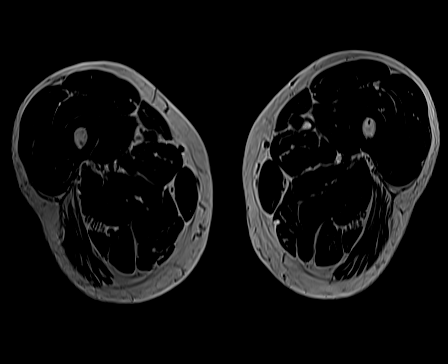
[im 7/60]
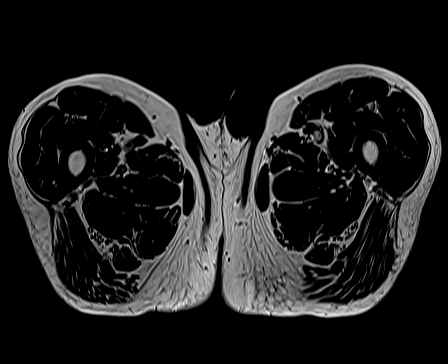
[im 20/60]
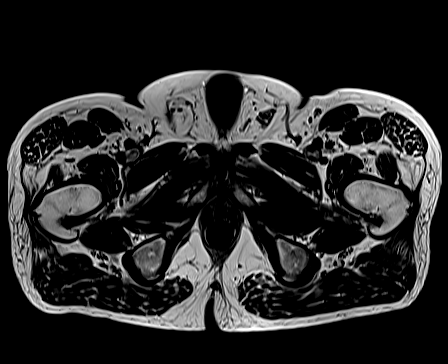
[im 27/60]
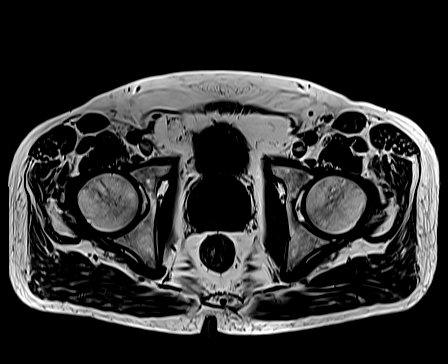
[im 33/60]
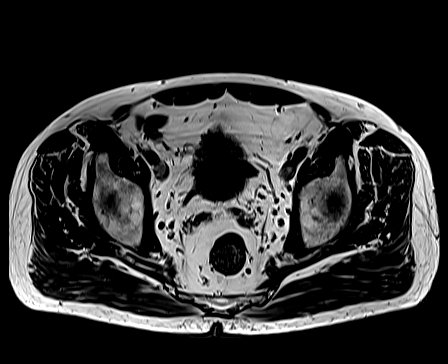
[im 40/60]
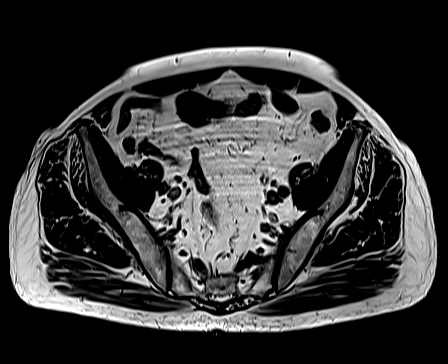
[im 53/60]
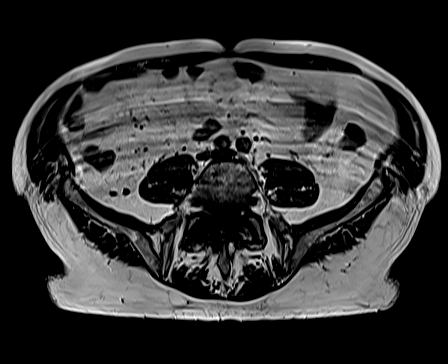
[im 60/60]
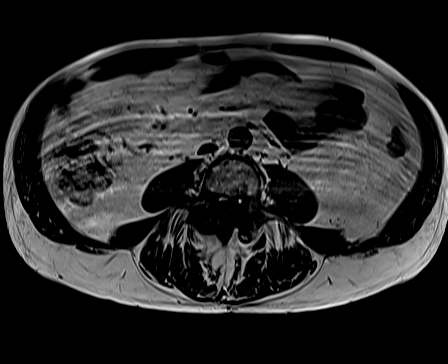

[Series 11: T2 fat-sat · axial · 4.0mm · 0.74mm/px · z∈[-323,-163]mm · 5 of 60 slices shown (2 of 2)]
[im 1/60]
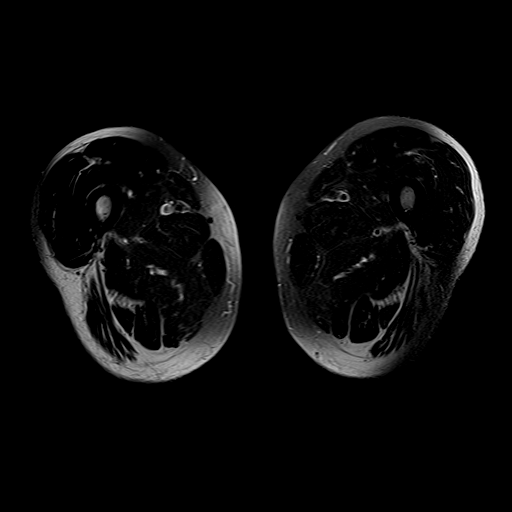
[im 7/60]
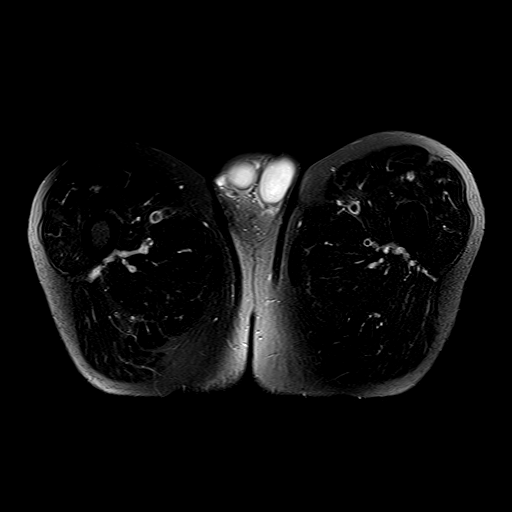
[im 20/60]
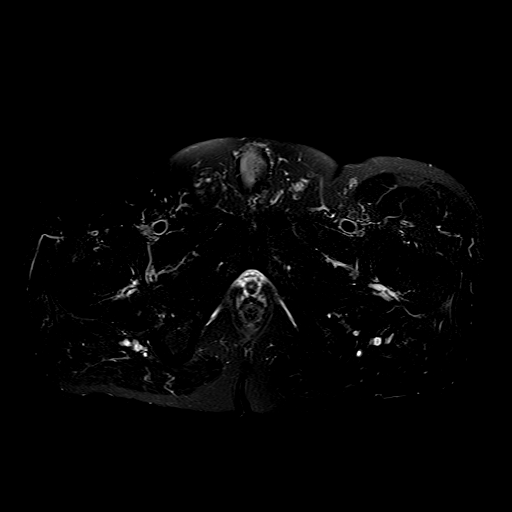
[im 27/60]
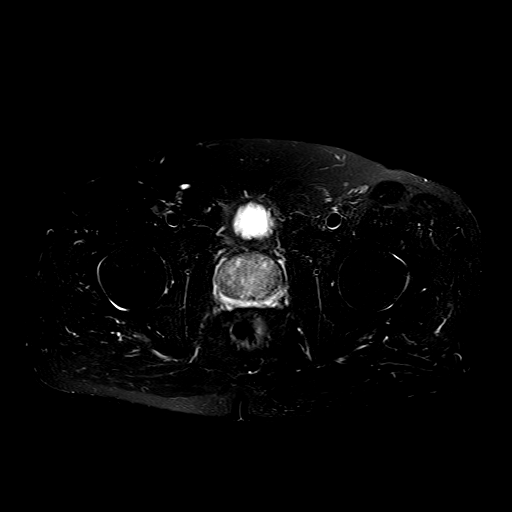
[im 33/60]
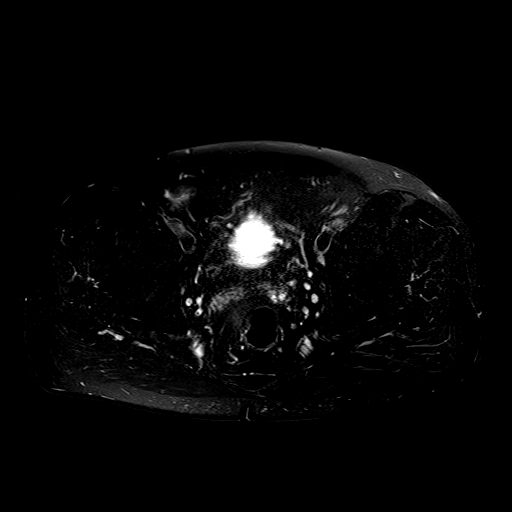

[35 of 48 positions shown; findings below may reference images not displayed]

FINDINGS: Bones:Lower lumbar spondylosis and degenerative disc disease, please
refer to dedicated lumbar spine MRI report. Spurring of the right
anterior iliac crest as seen on radiography. Possible postoperative
findings in this region.

No substantial marrow edema in the pelvis. The SI joints appear
unremarkable as does the pubis.

Articular cartilage and labrum

Articular cartilage:  Preserved articular cartilage

Labrum: No paralabral cyst or large labral tear is appreciated,
large field of view is not conducive in assessing the labrum.

Joint or bursal effusion

Joint effusion: Absent

Bursae: No regional bursitis

Muscles and tendons

Unremarkable

Other findings

Small diverticulum of the left side of the urinary bladder.
Prostatomegaly with encapsulated nodularity in the transition zone
of the prostate gland compatible with benign prostatic hypertrophy.
Small right groin hernia contains adipose tissue.
IMPRESSION: 1. No substantial hip or SI joint arthropathy.
2. Prostatomegaly.
3. Small right groin hernia contains adipose tissue.
4. Substantial lower lumbar spondylosis and degenerative disc
disease, refer to dedicated lumbar spine MRI report.

## 2021-01-04 ENCOUNTER — Other Ambulatory Visit: Payer: Self-pay

## 2021-01-04 ENCOUNTER — Encounter: Payer: Self-pay | Admitting: Orthopaedic Surgery

## 2021-01-04 ENCOUNTER — Ambulatory Visit (INDEPENDENT_AMBULATORY_CARE_PROVIDER_SITE_OTHER): Payer: Medicare Other | Admitting: Orthopaedic Surgery

## 2021-01-04 VITALS — Ht 69.0 in | Wt 163.0 lb

## 2021-01-04 DIAGNOSIS — M25551 Pain in right hip: Secondary | ICD-10-CM

## 2021-01-04 DIAGNOSIS — I2510 Atherosclerotic heart disease of native coronary artery without angina pectoris: Secondary | ICD-10-CM | POA: Diagnosis not present

## 2021-01-04 DIAGNOSIS — G114 Hereditary spastic paraplegia: Secondary | ICD-10-CM

## 2021-01-04 NOTE — Progress Notes (Signed)
My back is not hurting today.  He had the MRI of the lumbar spine showing: IMPRESSION: 1. No substantial hip or SI joint arthropathy. 2. Prostatomegaly. 3. Small right groin hernia contains adipose tissue. 4. 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  I have explained the findings to him.    I have independently reviewed the MRI.    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  He has not come to grips with it yet.  I have tried to explain the nature of the disease and that he may get progressively worse.  He wanted to know a time table and I told him I could not give that.  I spent 30 minutes talking to him and his wife about this.  He will need to see Dr. Merlene Laughter further for this.  They appear to understand.  Encounter Diagnoses  Name Primary?   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[facility]) Yes   Pain in right hip    To see Dr. Merlene Laughter.  Call if any problem.  Precautions discussed.  Electronically Signed Sanjuana Kava, MD 9/27/202211:27 AM

## 2021-11-14 ENCOUNTER — Other Ambulatory Visit: Payer: Self-pay

## 2021-11-14 ENCOUNTER — Other Ambulatory Visit: Payer: Self-pay | Admitting: *Deleted

## 2021-11-14 ENCOUNTER — Telehealth: Payer: Self-pay | Admitting: Family Medicine

## 2021-11-14 MED ORDER — PRAVASTATIN SODIUM 40 MG PO TABS: 40.0000 mg | ORAL_TABLET | Freq: Every day | ORAL | 3 refills | Status: DC

## 2021-11-14 NOTE — Telephone Encounter (Signed)
Pravastatin 40 mg #90, RF:3 refilled to [facility],pt has f/u apt with B.Strader, PA-C

## 2021-11-14 NOTE — Telephone Encounter (Signed)
*  STAT* If patient is at the pharmacy, call can be transferred to refill team.   1. Which medications need to be refilled? (please list name of each medication and dose if known) pravastatin (PRAVACHOL) 40 MG tablet  2. Which pharmacy/location (including street and city if local pharmacy) is medication to be sent to? [place], [place] [address]  3. Do they need a 30 day or 90 day supply? 90 day   Patient is completely out of medication. Has not had it for 2 days.

## 2021-11-14 NOTE — Telephone Encounter (Signed)
Advised request received from [facility] and pending response from provider. Gave 1st available appointment to see Strader 01/27/2022 '@3'$ :30 pm.  Verbalized understanding

## 2021-11-15 NOTE — Telephone Encounter (Signed)
Scheduled 01/27/2022 w/Strader 3:30 pm

## 2022-01-26 NOTE — Progress Notes (Deleted)
Cardiology Office Note    Date:  01/26/2022   ID:  Barry Powers, DOB Jun 02, 1945, MRN [id number]  PCP:  Celene Squibb, MD  Cardiologist: Previously Dr. Bronson Ing --> Needs to switch to new MD  No chief complaint on file.   History of Present Illness:    Barry Powers is a 76 y.o. male with past medical history of CAD (s/p NSTEMI in 2017 with occlusion of diagonal branch and too small for PCI with medical management recommended), HTN and HLD who presents to the office today for annual follow-up.   He was last examined by Katina Dung, NP in 11/2020 and denied any recent anginal symptoms at that time. He was continued on his current cardiac medications including ASA 81 mg daily, Toprol-XL 25 mg daily and Pravastatin 40 mg daily.  - labs from PCP   Past Medical History:  Diagnosis Date   Coronary artery disease    Hypercholesteremia    Hypertension    NSTEMI (non-ST elevated myocardial infarction) [facility])     Past Surgical History:  Procedure Laterality Date   CERVICAL LAMINECTOMY     LUMBAR SPINE SURGERY      Current Medications: Outpatient Medications Prior to Visit  Medication Sig Dispense Refill   aspirin EC 81 MG tablet Take 81 mg by mouth daily.     metoprolol succinate (TOPROL-XL) 25 MG 24 hr tablet Take 1 tablet (25 mg total) by mouth daily. 90 tablet 2   nitroGLYCERIN (NITROSTAT) 0.4 MG SL tablet Place 1 tablet (0.4 mg total) under the tongue every 5 (five) minutes as needed for chest pain. 25 tablet 3   pravastatin (PRAVACHOL) 40 MG tablet Take 1 tablet (40 mg total) by mouth daily. 90 tablet 3   No facility-administered medications prior to visit.     Allergies:   Penicillins   Social History   Socioeconomic History   Marital status: Married    Spouse name: Not on file   Number of children: Not on file   Years of education: Not on file   Highest education level: Not on file  Occupational History   Not on file  Tobacco Use   Smoking status: Never    Smokeless tobacco: Never  Vaping Use   Vaping Use: Never used  Substance and Sexual Activity   Alcohol use: No   Drug use: No   Sexual activity: Not on file  Other Topics Concern   Not on file  Social History Narrative   Not on file   Social Determinants of Health   Financial Resource Strain: Not on file  Food Insecurity: Not on file  Transportation Needs: Not on file  Physical Activity: Not on file  Stress: Not on file  Social Connections: Not on file     Family History:  The patient's ***family history includes Alzheimer's disease in his mother; Aneurysm in his father; COPD in his sister.   Review of Systems:    Please see the history of present illness.     All other systems reviewed and are otherwise negative except as noted above.   Physical Exam:    VS:  There were no vitals taken for this visit.   General: Well developed, well nourished,male appearing in no acute distress. Head: Normocephalic, atraumatic. Neck: No carotid bruits. JVD not elevated.  Lungs: Respirations regular and unlabored, without wheezes or rales.  Heart: ***Regular rate and rhythm. No S3 or S4.  No murmur, no rubs, or gallops appreciated. Abdomen:  Appears non-distended. No obvious abdominal masses. Msk:  Strength and tone appear normal for age. No obvious joint deformities or effusions. Extremities: No clubbing or cyanosis. No edema.  Distal pedal pulses are 2+ bilaterally. Neuro: Alert and oriented X 3. Moves all extremities spontaneously. No focal deficits noted. Psych:  Responds to questions appropriately with a normal affect. Skin: No rashes or lesions noted  Wt Readings from Last 3 Encounters:  01/04/21 163 lb (73.9 kg)  12/14/20 163 lb (73.9 kg)  11/09/20 170 lb 3.2 oz (77.2 kg)        Studies/Labs Reviewed:   EKG:  EKG is*** ordered today.  The ekg ordered today demonstrates ***  Recent Labs: No results found for requested labs within last 365 days.   Lipid Panel No  results found for: "CHOL", "TRIG", "HDL", "CHOLHDL", "VLDL", "LDLCALC", "LDLDIRECT"  Additional studies/ records that were reviewed today include:   MRI Pelvis: 12/2020 IMPRESSION: 1. No substantial hip or SI joint arthropathy. 2. Prostatomegaly. 3. Small right groin hernia contains adipose tissue. 4. 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  Assessment:    No diagnosis found.   Plan:   In order of problems listed above:  ***    Shared Decision Making/Informed Consent:   {Are you ordering a CV Procedure (e.g. stress test, cath, DCCV, TEE, etc)?   Press F2        :[id number]}    Medication Adjustments/Labs and Tests Ordered: Current medicines are reviewed at length with the patient today.  Concerns regarding medicines are outlined above.  Medication changes, Labs and Tests ordered today are listed in the Patient Instructions below. There are no Patient Instructions on file for this visit.   Signed, Erma Heritage, PA-C  01/26/2022 5:10 PM    [facility] [address] [place], [place] [address] Phone: [phone] Fax: [phone]

## 2022-01-27 ENCOUNTER — Ambulatory Visit: Payer: Medicare Other | Admitting: Cardiovascular Disease

## 2022-01-27 ENCOUNTER — Ambulatory Visit: Payer: Medicare Other | Admitting: Student

## 2022-01-30 ENCOUNTER — Other Ambulatory Visit: Payer: Self-pay | Admitting: *Deleted

## 2022-01-30 MED ORDER — METOPROLOL SUCCINATE ER 25 MG PO TB24: 25.0000 mg | ORAL_TABLET | Freq: Every day | ORAL | 0 refills | Status: DC

## 2022-01-31 ENCOUNTER — Other Ambulatory Visit (HOSPITAL_COMMUNITY)
Admission: RE | Admit: 2022-01-31 | Discharge: 2022-01-31 | Disposition: A | Discharge status: Home / Self Care | Payer: Medicare Other | Source: Ambulatory Visit | Attending: Internal Medicine | Admitting: Internal Medicine

## 2022-01-31 ENCOUNTER — Ambulatory Visit (INDEPENDENT_AMBULATORY_CARE_PROVIDER_SITE_OTHER): Payer: Medicare Other | Admitting: Internal Medicine

## 2022-01-31 ENCOUNTER — Encounter: Payer: Self-pay | Admitting: Internal Medicine

## 2022-01-31 VITALS — BP 146/76 | HR 73 | Ht 69.0 in | Wt 166.0 lb

## 2022-01-31 DIAGNOSIS — E785 Hyperlipidemia, unspecified: Secondary | ICD-10-CM | POA: Diagnosis not present

## 2022-01-31 DIAGNOSIS — I2510 Atherosclerotic heart disease of native coronary artery without angina pectoris: Secondary | ICD-10-CM | POA: Insufficient documentation

## 2022-01-31 LAB — LIPID PANEL
Cholesterol: 266 mg/dL — ABNORMAL HIGH (ref 0–200)
HDL: 37 mg/dL — ABNORMAL LOW (ref >40)
LDL Cholesterol: 176 mg/dL — ABNORMAL HIGH (ref 0–99)
Total CHOL/HDL Ratio: 7.2 RATIO
Triglycerides: 263 mg/dL — ABNORMAL HIGH (ref <150)
VLDL: 53 mg/dL — ABNORMAL HIGH (ref 0–40)

## 2022-01-31 LAB — COMPREHENSIVE METABOLIC PANEL
ALT: 13 U/L (ref 0–44)
AST: 13 U/L — ABNORMAL LOW (ref 15–41)
Albumin: 4 g/dL (ref 3.5–5.0)
Alkaline Phosphatase: 52 U/L (ref 38–126)
Anion gap: 6 (ref 5–15)
BUN: 12 mg/dL (ref 8–23)
CO2: 26 mmol/L (ref 22–32)
Calcium: 9 mg/dL (ref 8.9–10.3)
Chloride: 108 mmol/L (ref 98–111)
Creatinine, Ser: 1.33 mg/dL — ABNORMAL HIGH (ref 0.61–1.24)
GFR, Estimated: 56 mL/min — ABNORMAL LOW (ref >60)
Glucose, Bld: 96 mg/dL (ref 70–99)
Potassium: 4 mmol/L (ref 3.5–5.1)
Sodium: 140 mmol/L (ref 135–145)
Total Bilirubin: 0.7 mg/dL (ref 0.3–1.2)
Total Protein: 6.6 g/dL (ref 6.5–8.1)

## 2022-01-31 LAB — CBC
HCT: 46.1 % (ref 39.0–52.0)
Hemoglobin: 15.4 g/dL (ref 13.0–17.0)
MCH: 28.9 pg (ref 26.0–34.0)
MCHC: 33.4 g/dL (ref 30.0–36.0)
MCV: 86.7 fL (ref 80.0–100.0)
Platelets: 193 10*3/uL (ref 150–400)
RBC: 5.32 MIL/uL (ref 4.22–5.81)
RDW: 15.1 % (ref 11.5–15.5)
WBC: 7.8 10*3/uL (ref 4.0–10.5)
nRBC: 0 % (ref 0.0–0.2)

## 2022-01-31 MED ORDER — NITROGLYCERIN 0.4 MG SL SUBL: 0.4000 mg | SUBLINGUAL_TABLET | SUBLINGUAL | 3 refills | Status: AC | PRN

## 2022-01-31 MED ORDER — METOPROLOL SUCCINATE ER 25 MG PO TB24: 25.0000 mg | ORAL_TABLET | Freq: Every day | ORAL | 0 refills | Status: DC

## 2022-01-31 MED ORDER — ROSUVASTATIN CALCIUM 40 MG PO TABS: 40.0000 mg | ORAL_TABLET | Freq: Every day | ORAL | 3 refills | Status: DC

## 2022-01-31 MED ORDER — PRAVASTATIN SODIUM 40 MG PO TABS: 40.0000 mg | ORAL_TABLET | Freq: Every day | ORAL | 3 refills | Status: DC

## 2022-01-31 NOTE — Progress Notes (Signed)
Cardiology Office Note  Date: 01/31/2022   ID: Barry Powers, Barry Powers Aug 28, 1945, MRN [id number]  PCP:  Celene Squibb, MD  Cardiologist:  Chalmers Guest, MD Electrophysiologist:  None   Reason for Office Visit: Follow-up of CAD   History of Present Illness: Barry Powers is a 76 y.o. male known to have CAD manifested by NSTEMI in 2017 s/p subtotal occlusion of small diagonal branch on medical management, HTN, HLD, hereditary spastic paraplegia presented to cardiology clinic for follow-up visit. No interval ER visits or hospitalizations. Patient denied any rest or exertional chest discomfort, tightness, heaviness or pressure, rest or exertional dyspnea (walking is limited due to spastic paraplegia and also has risk of falls), palpitations, light-headedness, syncope and LE swelling. Compliant with medications and no side-effects. No bleeding complications. Denied smoking cigarettes, alcohol use and illicit drug use. Checks blood pressure at home which is less than 130/80 mmHg.   Past Medical History:  Diagnosis Date   Coronary artery disease    Hypercholesteremia    Hypertension    NSTEMI (non-ST elevated myocardial infarction) [facility])     Past Surgical History:  Procedure Laterality Date   CERVICAL LAMINECTOMY     LUMBAR SPINE SURGERY      Current Outpatient Medications  Medication Sig Dispense Refill   aspirin EC 81 MG tablet Take 81 mg by mouth daily.     metoprolol succinate (TOPROL-XL) 25 MG 24 hr tablet Take 1 tablet (25 mg total) by mouth daily. 30 tablet 0   nitroGLYCERIN (NITROSTAT) 0.4 MG SL tablet Place 1 tablet (0.4 mg total) under the tongue every 5 (five) minutes as needed for chest pain. 25 tablet 3   pravastatin (PRAVACHOL) 40 MG tablet Take 1 tablet (40 mg total) by mouth daily. 90 tablet 3   No current facility-administered medications for this visit.   Allergies:  Penicillins   Social History: The patient  reports that he has never smoked. He has never  used smokeless tobacco. He reports that he does not drink alcohol and does not use drugs.   Family History: The patient's family history includes Alzheimer's disease in his mother; Aneurysm in his father; COPD in his sister.   ROS:  Please see the history of present illness. Otherwise, complete review of systems is positive for none.  All other systems are reviewed and negative.   Physical Exam: VS:  BP (!) 146/76   Pulse 73   Ht '5\' 9"'$  (1.753 m)   Wt 166 lb (75.3 kg)   SpO2 96%   BMI 24.51 kg/m , BMI Body mass index is 24.51 kg/m.  Wt Readings from Last 3 Encounters:  01/31/22 166 lb (75.3 kg)  01/04/21 163 lb (73.9 kg)  12/14/20 163 lb (73.9 kg)    General: Patient appears comfortable at rest. HEENT: Conjunctiva and lids normal, oropharynx clear with moist mucosa. Neck: Supple, no elevated JVP or carotid bruits, no thyromegaly. Lungs: Clear to auscultation, nonlabored breathing at rest. Cardiac: Regular rate and rhythm, no S3 or significant systolic murmur, no pericardial rub. Abdomen: Soft, nontender, no hepatomegaly, bowel sounds present, no guarding or rebound. Extremities: No pitting edema, distal pulses 2+. Skin: Warm and dry. Musculoskeletal: No kyphosis. Neuropsychiatric: Alert and oriented x3, affect grossly appropriate.  ECG:  An ECG dated 01/31/22 was personally reviewed today and demonstrated:  NSR  Recent Labwork: 01/31/2022: ALT 13; AST 13; BUN 12; Creatinine, Ser 1.33; Hemoglobin 15.4; Platelets 193; Potassium 4.0; Sodium 140     Component  Value Date/Time   CHOL 266 (H) 01/31/2022 1352   TRIG 263 (H) 01/31/2022 1352   HDL 37 (L) 01/31/2022 1352   CHOLHDL 7.2 01/31/2022 1352   VLDL 53 (H) 01/31/2022 1352   LDLCALC 176 (H) 01/31/2022 1352    Other Studies Reviewed Today: None  Assessment and Plan: Patient is a 76 year old M known to have CAD manifested by NSTEMI in 2017 s/p subtotal occlusion of small diagonal branch on medical management, HTN, HLD  presented to cardiology clinic for follow-up visit.  #CAD manifested by NSTEMI in 2017 s/p subtotal occlusion of small diagonal branch on medical management, currently angina free Plan -Continue aspirin 81 mg once daily -Switch pravastatin to rosuvastatin 40 mg nightly -Continue metoprolol succinate 25 mg once daily -SL NTG 0.4 mg as needed  #HLD, currently not at goal Plan -Switch pravastatin to rosuvastatin 40 mg nightly.  LDL today is 176.  Goal is LDL less than 70.  #HTN,controlled Plan -Continue metoprolol succinate 25 mg once daily.  Blood pressure readings at home are within normal limits.  I have spent a total of 33 minutes with patient reviewing chart,, EKGs, labs and examining patient as well as establishing an assessment and plan that was discussed with the patient.  > 50% of time was spent in direct patient care.     Medication Adjustments/Labs and Tests Ordered: Current medicines are reviewed at length with the patient today.  Concerns regarding medicines are outlined above.   Tests Ordered: Orders Placed This Encounter  Procedures   Comprehensive Metabolic Panel (CMET)   Lipid panel   CBC   EKG 12-Lead    Medication Changes: Meds ordered this encounter  Medications   metoprolol succinate (TOPROL-XL) 25 MG 24 hr tablet    Sig: Take 1 tablet (25 mg total) by mouth daily.    Dispense:  30 tablet    Refill:  0   nitroGLYCERIN (NITROSTAT) 0.4 MG SL tablet    Sig: Place 1 tablet (0.4 mg total) under the tongue every 5 (five) minutes as needed for chest pain.    Dispense:  25 tablet    Refill:  3   pravastatin (PRAVACHOL) 40 MG tablet    Sig: Take 1 tablet (40 mg total) by mouth daily.    Dispense:  90 tablet    Refill:  3    Disposition:  Follow up  one year  Signed, Peighton Edgin Fidel Levy, MD, 01/31/2022 2:54 PM     [facility] [address] [place], [place] [address]

## 2022-01-31 NOTE — Patient Instructions (Addendum)
Medication Instructions:  Your physician recommends that you continue on your current medications as directed. Please refer to the Current Medication list given to you today.   Labwork: CMET CBC Lipid Panel  Testing/Procedures: None  Follow-Up: Follow up with Dr. Dellia Cloud in 1 year  Any Other Special Instructions Will Be Listed Below (If Applicable).  [facility] Dr[address] Ihor Dow Dr. Tula Nakayama [address] [place] [address] [phone]    If you need a refill on your cardiac medications before your next appointment, please call your pharmacy.

## 2022-01-31 NOTE — Addendum Note (Signed)
Addended by: Christella Scheuermann C on: 01/31/2022 03:27 PM   Modules accepted: Orders

## 2022-02-28 ENCOUNTER — Ambulatory Visit (INDEPENDENT_AMBULATORY_CARE_PROVIDER_SITE_OTHER): Payer: Medicare Other | Admitting: Family Medicine

## 2022-02-28 ENCOUNTER — Encounter: Payer: Self-pay | Admitting: Family Medicine

## 2022-02-28 VITALS — BP 132/82 | HR 59 | Ht 69.0 in | Wt 168.1 lb

## 2022-02-28 DIAGNOSIS — G114 Hereditary spastic paraplegia: Secondary | ICD-10-CM | POA: Diagnosis not present

## 2022-02-28 DIAGNOSIS — R399 Unspecified symptoms and signs involving the genitourinary system: Secondary | ICD-10-CM

## 2022-02-28 MED ORDER — OXYBUTYNIN CHLORIDE 2.5 MG PO TABS: 2.5000 mg | ORAL_TABLET | Freq: Two times a day (BID) | ORAL | 2 refills | Status: DC

## 2022-02-28 MED ORDER — TIZANIDINE HCL 4 MG PO CAPS: 4.0000 mg | ORAL_CAPSULE | Freq: Two times a day (BID) | ORAL | 1 refills | Status: DC

## 2022-02-28 MED ORDER — OXYBUTYNIN CHLORIDE 5 MG PO TABS: 5.0000 mg | ORAL_TABLET | Freq: Two times a day (BID) | ORAL | 1 refills | Status: DC

## 2022-02-28 MED ORDER — TAMSULOSIN HCL 0.4 MG PO CAPS: 0.4000 mg | ORAL_CAPSULE | Freq: Every day | ORAL | 3 refills | Status: DC

## 2022-02-28 NOTE — Assessment & Plan Note (Signed)
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

## 2022-02-28 NOTE — Progress Notes (Signed)
New Patient Office Visit  Subjective:  Patient ID: Barry Powers, male    DOB: Apr 26, 1945  Age: 76 y.o. MRN: [id number]  CC:  Chief Complaint  Patient presents with   Establish Care    New patient, has had multiple falls and is concerned about his balance his legs give out at times this has been happening for years but his health seems to be getting worse. Has not had a pcp in a while. Has urinary incontinence.     HPI Barry Powers is a 76 y.o. male with past medical history of Hereditary spastic paraplegia, and urinary incontinence presents for establishing care.  Hereditary spastic paraplegia: Diagnosed at [facility] in 2021. The patient complains of gait impairment and leg weakness, which has led to multiple falls.  He reports that his condition has progressively worsened over the years.  He reports seeing neurology and orthopedics concerning his symptoms and has been told the nature of his neurological disease and that it would only get progressively worse.  He reports attempting physical therapy numerous times with minimal relief of his symptoms.  LUTS: He complains of urinary incontinence.  Onset of symptoms 2 years ago.  He complains of urgency, frequency, poor urinary stream, and feeling like he cannot fully empty his bladder.  Past Medical History:  Diagnosis Date   Coronary artery disease    Hypercholesteremia    Hypertension    NSTEMI (non-ST elevated myocardial infarction) [facility])     Past Surgical History:  Procedure Laterality Date   CERVICAL LAMINECTOMY     LUMBAR SPINE SURGERY      Family History  Problem Relation Age of Onset   Alzheimer's disease Mother    Aneurysm Father    COPD Sister     Social History   Socioeconomic History   Marital status: Married    Spouse name: Not on file   Number of children: Not on file   Years of education: Not on file   Highest education level: Not on file  Occupational History   Not on file  Tobacco Use   Smoking  status: Never   Smokeless tobacco: Never  Vaping Use   Vaping Use: Never used  Substance and Sexual Activity   Alcohol use: No   Drug use: No   Sexual activity: Not on file  Other Topics Concern   Not on file  Social History Narrative   Not on file   Social Determinants of Health   Financial Resource Strain: Not on file  Food Insecurity: Not on file  Transportation Needs: Not on file  Physical Activity: Not on file  Stress: Not on file  Social Connections: Not on file  Intimate Partner Violence: Not on file    ROS Review of Systems  Constitutional:  Negative for fatigue and fever.  HENT:  Negative for sinus pressure and sore throat.   Eyes:  Negative for photophobia and visual disturbance.  Respiratory:  Negative for chest tightness and shortness of breath.   Cardiovascular:  Negative for chest pain and palpitations.  Gastrointestinal:  Negative for diarrhea, nausea and vomiting.  Endocrine: Negative for polydipsia, polyphagia and polyuria.  Genitourinary:  Positive for frequency and urgency.  Musculoskeletal:  Positive for arthralgias, gait problem and myalgias.  Neurological:  Positive for weakness. Negative for dizziness.  Psychiatric/Behavioral:  Negative for self-injury and suicidal ideas.     Objective:   Today's Vitals: BP 132/82 (BP Location: Left Arm)   Pulse (!) 59  Ht '5\' 9"'$  (1.753 m)   Wt 168 lb 0.8 oz (76.2 kg)   SpO2 97%   BMI 24.82 kg/m   Physical Exam HENT:     Head: Normocephalic.  Cardiovascular:     Rate and Rhythm: Normal rate and regular rhythm.  Neurological:     Mental Status: He is alert.     Motor: Weakness present.     Coordination: Coordination abnormal.     Gait: Gait abnormal.     Deep Tendon Reflexes: Reflexes abnormal.     Reflex Scores:      Patellar reflexes are 0 on the right side and 0 on the left side.     Assessment & Plan:   Hereditary spastic paraplegia [facility]) Assessment & Plan: Diagnosed at [facility] in 2021 There  is no disease modifying treatment for HSP Will start the patient on Zanaflex 4 mg twice daily to help with spasticity The patient reports that his insurance does not cover prescription drugs Inform the patient to let me know if he is having any difficulties affording his medication Discussed the various patient assistant programs that we have to offer  Orders: -     tiZANidine HCl; Take 1 capsule (4 mg total) by mouth in the morning and at bedtime.  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  Dispense: 30 capsule; Refill: 3 -     Ambulatory referral to Urology -     oxyBUTYnin Chloride; Take 1 tablet (5 mg total) by mouth 2 (two) times daily.  Dispense: 60 tablet; Refill: 1     Follow-up: Return in about 3 months (around 05/31/2022).   Alvira Monday, FNP

## 2022-02-28 NOTE — Patient Instructions (Addendum)
I appreciate the opportunity to provide care to you today!    Follow up:  3 months  Labs: Next visit  Please pick up your medication at the pharmacy   Referrals today- urology   Please continue to a heart-healthy diet and increase your physical activities. Try to exercise for 48mns at least three times a week.      It was a pleasure to see you and I look forward to continuing to work together on your health and well-being. Please do not hesitate to call the office if you need care or have questions about your care.   Have a wonderful day and week. With Gratitude, GAlvira MondayMSN, FNP-BC

## 2022-02-28 NOTE — Assessment & Plan Note (Signed)
Diagnosed at [facility] in 2021 There is no disease modifying treatment for HSP Will start the patient on Zanaflex 4 mg twice daily to help with spasticity The patient reports that his insurance does not cover prescription drugs Inform the patient to let me know if he is having any difficulties affording his medication Discussed the various patient assistant programs that we have to offer

## 2022-03-08 ENCOUNTER — Telehealth: Payer: Self-pay | Admitting: Internal Medicine

## 2022-03-08 ENCOUNTER — Other Ambulatory Visit: Payer: Self-pay | Admitting: Family Medicine

## 2022-03-08 DIAGNOSIS — R399 Unspecified symptoms and signs involving the genitourinary system: Secondary | ICD-10-CM

## 2022-03-08 NOTE — Telephone Encounter (Signed)
Please send all of pt's Rx's to [facility] in [place] for 90 day supply  Pt states that he doesn't want to switch pravastatin to rosuvastatin 40 mg nightly that was changed on 01/31/22 visit  Please call pt

## 2022-03-08 NOTE — Telephone Encounter (Signed)
Returned call to pt. No answer. No voice mail.  

## 2022-03-09 NOTE — Telephone Encounter (Signed)
Spoke with pt and wife who states that patient agrees to switch to Crestor. Pt reports that he still has 2 mon left of pravastatin and would like to complete due to paying out of pocket for his medications. Pt states that he will pick up crestor on today and start when finish with pravastatin. Please advise.

## 2022-03-09 NOTE — Telephone Encounter (Signed)
Noted MD response.

## 2022-03-10 ENCOUNTER — Telehealth: Payer: Self-pay | Admitting: Family Medicine

## 2022-03-10 NOTE — Telephone Encounter (Signed)
Barry Powers w.  [facility] called in on patient.  Needs clarification on new script

## 2022-03-14 ENCOUNTER — Ambulatory Visit (INDEPENDENT_AMBULATORY_CARE_PROVIDER_SITE_OTHER): Payer: Medicare Other | Admitting: Urology

## 2022-03-14 ENCOUNTER — Encounter: Payer: Self-pay | Admitting: Urology

## 2022-03-14 VITALS — BP 133/75 | HR 56

## 2022-03-14 DIAGNOSIS — N138 Other obstructive and reflux uropathy: Secondary | ICD-10-CM | POA: Diagnosis not present

## 2022-03-14 DIAGNOSIS — N529 Male erectile dysfunction, unspecified: Secondary | ICD-10-CM | POA: Diagnosis not present

## 2022-03-14 DIAGNOSIS — I2510 Atherosclerotic heart disease of native coronary artery without angina pectoris: Secondary | ICD-10-CM

## 2022-03-14 DIAGNOSIS — R3915 Urgency of urination: Secondary | ICD-10-CM

## 2022-03-14 DIAGNOSIS — R350 Frequency of micturition: Secondary | ICD-10-CM | POA: Diagnosis not present

## 2022-03-14 DIAGNOSIS — N401 Enlarged prostate with lower urinary tract symptoms: Secondary | ICD-10-CM

## 2022-03-14 LAB — URINALYSIS, ROUTINE W REFLEX MICROSCOPIC
Bilirubin, UA: NEGATIVE
Glucose, UA: NEGATIVE
Ketones, UA: NEGATIVE
Nitrite, UA: NEGATIVE
Specific Gravity, UA: 1.02 (ref 1.005–1.030)
Urobilinogen, Ur: 0.2 mg/dL (ref 0.2–1.0)
pH, UA: 5.5 (ref 5.0–7.5)

## 2022-03-14 LAB — MICROSCOPIC EXAMINATION: WBC, UA: >30 /hpf — AB (ref 0–5)

## 2022-03-14 NOTE — Progress Notes (Addendum)
H&P  Chief Complaint: Urinary difficulty  History of Present Illness: 76 year old male with hereditary spastic paraplegia and subsequent mobility limitations presents for evaluation and management of urinary frequency, urgency, urgency incontinence.  Symptoms have been worsening over the past few years.  His neurologic diagnosis goes back about 20 years.  He was recently put on tamsulosin which has helped his urinary symptomatology.  He was also prescribed oxybutynin which he has not started yet.  This is his first visit to a urologist.  IPSS 13, quality-of-life score 5  Past Medical History:  Diagnosis Date   Coronary artery disease    Hypercholesteremia    Hypertension    NSTEMI (non-ST elevated myocardial infarction) [facility])     Past Surgical History:  Procedure Laterality Date   CERVICAL LAMINECTOMY     LUMBAR SPINE SURGERY      Home Medications:  Allergies as of 03/14/2022       Reactions   Penicillins Other (See Comments)   "Blacked-out"        Medication List        Accurate as of March 14, 2022  6:30 AM. If you have any questions, ask your nurse or doctor.          aspirin EC 81 MG tablet Take 81 mg by mouth daily.   metoprolol succinate 25 MG 24 hr tablet Commonly known as: TOPROL-XL Take 1 tablet (25 mg total) by mouth daily.   nitroGLYCERIN 0.4 MG SL tablet Commonly known as: NITROSTAT Place 1 tablet (0.4 mg total) under the tongue every 5 (five) minutes as needed for chest pain.   oxybutynin 5 MG tablet Commonly known as: DITROPAN Take 1 tablet (5 mg total) by mouth 2 (two) times daily.   rosuvastatin 40 MG tablet Commonly known as: CRESTOR Take 1 tablet (40 mg total) by mouth daily.   tamsulosin 0.4 MG Caps capsule Commonly known as: FLOMAX Take 1 capsule (0.4 mg total) by mouth daily.   tiZANidine 4 MG capsule Commonly known as: Zanaflex Take 1 capsule (4 mg total) by mouth in the morning and at bedtime.        Allergies:   Allergies  Allergen Reactions   Penicillins Other (See Comments)    "Blacked-out"    Family History  Problem Relation Age of Onset   Alzheimer's disease Mother    Aneurysm Father    COPD Sister     Social History:  reports that he has never smoked. He has never used smokeless tobacco. He reports that he does not drink alcohol and does not use drugs.  ROS: A complete review of systems was performed.  All systems are negative except for pertinent findings as noted.  Physical Exam:  Vital signs in last 24 hours: There were no vitals taken for this visit. Constitutional:  Alert and oriented, No acute distress Cardiovascular: Regular rate  Respiratory: Normal respiratory effort Genitourinary: Normal uncircumcised male phallus, testes are descended bilaterally and non-tender and without masses, scrotum is normal in appearance without lesions or masses, perineum is normal on inspection.  Normal sphincter tone.  Prostate 40 g, symmetric, nonnodular, nontender. Lymphatic: No lymphadenopathy Psychiatric: Normal mood and affect  I have reviewed prior pt notes  I have reviewed notes from referring/previous physicians  I have reviewed urinalysis results  I have independently reviewed bladder scan-residual volume 56 mL  IPS test results reviewed   Impression/Assessment:  1.  BPH with symptoms-improved somewhat with tamsulosin alone.  He empties out well  2.  Overactive bladder symptoms  Plan:  1.  Overactive bladder guide sheet given  2.  Continue on tamsulosin  3.  I gave him 6 weeks of Gemtesa 75 mg to take-once a day  4.  I will see him back in 6 weeks to recheck symptoms as well as postvoid residual

## 2022-03-28 ENCOUNTER — Inpatient Hospital Stay (HOSPITAL_COMMUNITY)
Admission: EM | Admit: 2022-03-28 | Discharge: 2022-03-31 | DRG: 194 | Disposition: A | Discharge status: Home Health | Payer: Medicare Other | Attending: Internal Medicine | Admitting: Internal Medicine

## 2022-03-28 ENCOUNTER — Encounter (HOSPITAL_COMMUNITY): Payer: Self-pay

## 2022-03-28 ENCOUNTER — Emergency Department (HOSPITAL_COMMUNITY): Payer: Medicare Other

## 2022-03-28 ENCOUNTER — Other Ambulatory Visit: Payer: Self-pay

## 2022-03-28 DIAGNOSIS — I10 Essential (primary) hypertension: Secondary | ICD-10-CM | POA: Diagnosis present

## 2022-03-28 DIAGNOSIS — R059 Cough, unspecified: Secondary | ICD-10-CM | POA: Diagnosis not present

## 2022-03-28 DIAGNOSIS — E7800 Pure hypercholesterolemia, unspecified: Secondary | ICD-10-CM | POA: Diagnosis present

## 2022-03-28 DIAGNOSIS — Z825 Family history of asthma and other chronic lower respiratory diseases: Secondary | ICD-10-CM

## 2022-03-28 DIAGNOSIS — R062 Wheezing: Secondary | ICD-10-CM

## 2022-03-28 DIAGNOSIS — Z79899 Other long term (current) drug therapy: Secondary | ICD-10-CM | POA: Diagnosis not present

## 2022-03-28 DIAGNOSIS — R296 Repeated falls: Secondary | ICD-10-CM | POA: Diagnosis not present

## 2022-03-28 DIAGNOSIS — J432 Centrilobular emphysema: Secondary | ICD-10-CM | POA: Diagnosis not present

## 2022-03-28 DIAGNOSIS — E860 Dehydration: Secondary | ICD-10-CM | POA: Diagnosis present

## 2022-03-28 DIAGNOSIS — Z7982 Long term (current) use of aspirin: Secondary | ICD-10-CM | POA: Diagnosis not present

## 2022-03-28 DIAGNOSIS — G114 Hereditary spastic paraplegia: Secondary | ICD-10-CM | POA: Diagnosis not present

## 2022-03-28 DIAGNOSIS — J90 Pleural effusion, not elsewhere classified: Secondary | ICD-10-CM | POA: Diagnosis not present

## 2022-03-28 DIAGNOSIS — J189 Pneumonia, unspecified organism: Principal | ICD-10-CM

## 2022-03-28 DIAGNOSIS — J121 Respiratory syncytial virus pneumonia: Secondary | ICD-10-CM | POA: Diagnosis not present

## 2022-03-28 DIAGNOSIS — M25552 Pain in left hip: Secondary | ICD-10-CM | POA: Diagnosis not present

## 2022-03-28 DIAGNOSIS — R0602 Shortness of breath: Secondary | ICD-10-CM | POA: Diagnosis not present

## 2022-03-28 DIAGNOSIS — R5383 Other fatigue: Secondary | ICD-10-CM | POA: Diagnosis not present

## 2022-03-28 DIAGNOSIS — W19XXXA Unspecified fall, initial encounter: Secondary | ICD-10-CM

## 2022-03-28 DIAGNOSIS — N179 Acute kidney failure, unspecified: Secondary | ICD-10-CM | POA: Diagnosis present

## 2022-03-28 DIAGNOSIS — I2510 Atherosclerotic heart disease of native coronary artery without angina pectoris: Secondary | ICD-10-CM | POA: Diagnosis present

## 2022-03-28 DIAGNOSIS — J168 Pneumonia due to other specified infectious organisms: Secondary | ICD-10-CM | POA: Diagnosis not present

## 2022-03-28 DIAGNOSIS — Z1152 Encounter for screening for COVID-19: Secondary | ICD-10-CM | POA: Diagnosis not present

## 2022-03-28 DIAGNOSIS — R531 Weakness: Secondary | ICD-10-CM

## 2022-03-28 DIAGNOSIS — Z820 Family history of epilepsy and other diseases of the nervous system: Secondary | ICD-10-CM | POA: Diagnosis not present

## 2022-03-28 DIAGNOSIS — K5900 Constipation, unspecified: Secondary | ICD-10-CM | POA: Diagnosis present

## 2022-03-28 DIAGNOSIS — I252 Old myocardial infarction: Secondary | ICD-10-CM | POA: Diagnosis not present

## 2022-03-28 DIAGNOSIS — J129 Viral pneumonia, unspecified: Secondary | ICD-10-CM | POA: Diagnosis present

## 2022-03-28 DIAGNOSIS — N390 Urinary tract infection, site not specified: Secondary | ICD-10-CM | POA: Diagnosis present

## 2022-03-28 DIAGNOSIS — R0902 Hypoxemia: Secondary | ICD-10-CM | POA: Diagnosis present

## 2022-03-28 LAB — COMPREHENSIVE METABOLIC PANEL
ALT: 12 U/L (ref 0–44)
AST: 18 U/L (ref 15–41)
Albumin: 3.5 g/dL (ref 3.5–5.0)
Alkaline Phosphatase: 42 U/L (ref 38–126)
Anion gap: 8 (ref 5–15)
BUN: 14 mg/dL (ref 8–23)
CO2: 24 mmol/L (ref 22–32)
Calcium: 8.3 mg/dL — ABNORMAL LOW (ref 8.9–10.3)
Chloride: 105 mmol/L (ref 98–111)
Creatinine, Ser: 1.56 mg/dL — ABNORMAL HIGH (ref 0.61–1.24)
GFR, Estimated: 46 mL/min — ABNORMAL LOW (ref >60)
Glucose, Bld: 127 mg/dL — ABNORMAL HIGH (ref 70–99)
Potassium: 3.2 mmol/L — ABNORMAL LOW (ref 3.5–5.1)
Sodium: 137 mmol/L (ref 135–145)
Total Bilirubin: 0.9 mg/dL (ref 0.3–1.2)
Total Protein: 6.6 g/dL (ref 6.5–8.1)

## 2022-03-28 LAB — CBC WITH DIFFERENTIAL/PLATELET
Abs Immature Granulocytes: 0.02 10*3/uL (ref 0.00–0.07)
Basophils Absolute: 0.1 10*3/uL (ref 0.0–0.1)
Basophils Relative: 1 %
Eosinophils Absolute: 0 10*3/uL (ref 0.0–0.5)
Eosinophils Relative: 0 %
HCT: 43 % (ref 39.0–52.0)
Hemoglobin: 14.2 g/dL (ref 13.0–17.0)
Immature Granulocytes: 0 %
Lymphocytes Relative: 40 %
Lymphs Abs: 3 10*3/uL (ref 0.7–4.0)
MCH: 28.8 pg (ref 26.0–34.0)
MCHC: 33 g/dL (ref 30.0–36.0)
MCV: 87.2 fL (ref 80.0–100.0)
Monocytes Absolute: 0.6 10*3/uL (ref 0.1–1.0)
Monocytes Relative: 8 %
Neutro Abs: 3.7 10*3/uL (ref 1.7–7.7)
Neutrophils Relative %: 51 %
Platelets: 146 10*3/uL — ABNORMAL LOW (ref 150–400)
RBC: 4.93 MIL/uL (ref 4.22–5.81)
RDW: 15.6 % — ABNORMAL HIGH (ref 11.5–15.5)
WBC: 7.3 10*3/uL (ref 4.0–10.5)
nRBC: 0 % (ref 0.0–0.2)

## 2022-03-28 LAB — TROPONIN I (HIGH SENSITIVITY)
Troponin I (High Sensitivity): 18 ng/L — ABNORMAL HIGH (ref <18)
Troponin I (High Sensitivity): 18 ng/L — ABNORMAL HIGH (ref <18)

## 2022-03-28 LAB — RESP PANEL BY RT-PCR (RSV, FLU A&B, COVID)  RVPGX2
Influenza A by PCR: NEGATIVE
Influenza B by PCR: NEGATIVE
Resp Syncytial Virus by PCR: POSITIVE — AB
SARS Coronavirus 2 by RT PCR: NEGATIVE

## 2022-03-28 LAB — BRAIN NATRIURETIC PEPTIDE: B Natriuretic Peptide: 112 pg/mL — ABNORMAL HIGH (ref 0.0–100.0)

## 2022-03-28 LAB — MAGNESIUM: Magnesium: 2.1 mg/dL (ref 1.7–2.4)

## 2022-03-28 LAB — CK: Total CK: 153 U/L (ref 49–397)

## 2022-03-28 MED ORDER — LACTATED RINGERS IV BOLUS
500.0000 mL | Freq: Once | INTRAVENOUS | Status: AC
  Administered 2022-03-29: 500 mL via INTRAVENOUS

## 2022-03-28 MED ORDER — IOHEXOL 350 MG/ML SOLN
75.0000 mL | Freq: Once | INTRAVENOUS | Status: AC | PRN
  Administered 2022-03-28: 75 mL via INTRAVENOUS

## 2022-03-28 NOTE — ED Triage Notes (Signed)
Rcems from home. Cc of fall. Pt has hx of spastic paralysis and sometimes his legs will go ut form under him. Tonight that happened while using his walker to go to the bathroom. Pt was initially c/o left hip pain but it no longer bothering him.  Pt also had sob with ems arrival. Neb given. 94% in room. Wheezing noted. Has been going on for 5 days . Someone sick at church.  24g L hand.

## 2022-03-28 NOTE — ED Provider Notes (Incomplete)
[facility] EMERGENCY DEPARTMENT Provider Note   CSN: [id number] Arrival date & time: 03/28/22  1933     History {Add pertinent medical, surgical, social history, OB history to HPI:1} Chief Complaint  Patient presents with   Shortness of Breath   Fall    Barry Powers is a 76 y.o. male.   Shortness of Breath Associated symptoms: cough   Fall Associated symptoms include shortness of breath.  Patient presents after a fall.  Medical history includes HTN, CAD, HLD, and hereditary spastic paraplegia.  At baseline, ambulates with a walker.  He lives at home with his wife, daughter, and grandchild.  Shortly prior to arrival, he was ambulating to the bathroom with his walker.  He felt like his legs gave out from under him.  This caused him to fall.  He was unable to get up off of the floor.  He states that on a good day he would be able to get up off of the floor.  EMS was called.  When they arrived on scene, patient was laying supine on the floor.  He was lifted on the stretcher.  He did not stand or ambulate with EMS.  He complained of cough and congestion which has been ongoing over the past week and actually improving.  EMS noted coarse lung sounds and wheezing.  He was given a breathing treatment during transit.  He reports improved symptoms following breathing treatment.  On arrival, patient denies any areas of discomfort, other than his sinus congestion and pressure.     Home Medications Prior to Admission medications   Medication Sig Start Date End Date Taking? Authorizing Provider  aspirin EC 81 MG tablet Take 81 mg by mouth daily.    [provider]  metoprolol succinate (TOPROL-XL) 25 MG 24 hr tablet Take 1 tablet (25 mg total) by mouth daily. 01/31/22   Josue Hector, MD  nitroGLYCERIN (NITROSTAT) 0.4 MG SL tablet Place 1 tablet (0.4 mg total) under the tongue every 5 (five) minutes as needed for chest pain. Patient not taking: Reported on 03/14/2022 01/31/22    Josue Hector, MD  oxybutynin (DITROPAN) 5 MG tablet Take 1 tablet (5 mg total) by mouth 2 (two) times daily. Patient not taking: Reported on 03/14/2022 02/28/22   Alvira Monday, FNP  rosuvastatin (CRESTOR) 40 MG tablet Take 1 tablet (40 mg total) by mouth daily. 01/31/22 01/26/23  Mallipeddi, Vishnu P, MD  tamsulosin (FLOMAX) 0.4 MG CAPS capsule Take 1 capsule (0.4 mg total) by mouth daily. 02/28/22   Alvira Monday, FNP  tiZANidine (ZANAFLEX) 4 MG capsule Take 1 capsule (4 mg total) by mouth in the morning and at bedtime. Patient not taking: Reported on 03/14/2022 02/28/22   Alvira Monday, FNP      Allergies    Penicillins    Review of Systems   Review of Systems  Constitutional:  Positive for fatigue.  HENT:  Positive for congestion and sinus pressure.   Respiratory:  Positive for cough and shortness of breath.   Neurological:  Positive for weakness (Generalized).  All other systems reviewed and are negative.   Physical Exam Updated Vital Signs Ht '5\' 9"'$  (1.753 m)   Wt 76.2 kg   BMI 24.81 kg/m  Physical Exam Vitals and nursing note reviewed.  Constitutional:      General: He is not in acute distress.    Appearance: He is well-developed and normal weight. He is not ill-appearing, toxic-appearing or diaphoretic.  HENT:  Head: Normocephalic and atraumatic.     Mouth/Throat:     Mouth: Mucous membranes are moist.  Eyes:     Extraocular Movements: Extraocular movements intact.     Conjunctiva/sclera: Conjunctivae normal.  Cardiovascular:     Rate and Rhythm: Normal rate and regular rhythm.     Heart sounds: No murmur heard. Pulmonary:     Effort: Pulmonary effort is normal. No tachypnea or respiratory distress.     Breath sounds: Normal breath sounds. No decreased breath sounds, wheezing, rhonchi or rales.  Abdominal:     Palpations: Abdomen is soft.     Tenderness: There is no abdominal tenderness.  Musculoskeletal:        General: No swelling.     Cervical  back: Normal range of motion and neck supple.     Right lower leg: No edema.     Left lower leg: No edema.  Skin:    General: Skin is warm and dry.     Capillary Refill: Capillary refill takes less than 2 seconds.     Coloration: Skin is not cyanotic or pale.  Neurological:     General: No focal deficit present.     Mental Status: He is alert and oriented to person, place, and time.  Psychiatric:        Mood and Affect: Mood normal.        Behavior: Behavior normal.     ED Results / Procedures / Treatments   Labs (all labs ordered are listed, but only abnormal results are displayed) Labs Reviewed - No data to display  EKG None  Radiology No results found.  Procedures Procedures  {Document cardiac monitor, telemetry assessment procedure when appropriate:1}  Medications Ordered in ED Medications - No data to display  ED Course/ Medical Decision Making/ A&P                           Medical Decision Making  This patient presents to the ED for concern of ***, this involves an extensive number of treatment options, and is a complaint that carries with it a high risk of complications and morbidity.  The differential diagnosis includes ***   Co morbidities that complicate the patient evaluation  ***   Additional history obtained:  Additional history obtained from *** External records from outside source obtained and reviewed including ***   Lab Tests:  I Ordered, and personally interpreted labs.  The pertinent results include:  ***   Imaging Studies ordered:  I ordered imaging studies including ***  I independently visualized and interpreted imaging which showed *** I agree with the radiologist interpretation   Cardiac Monitoring: / EKG:  The patient was maintained on a cardiac monitor.  I personally viewed and interpreted the cardiac monitored which showed an underlying rhythm of: ***   Consultations Obtained:  I requested consultation with the ***,   and discussed lab and imaging findings as well as pertinent plan - they recommend: ***   Problem List / ED Course / Critical interventions / Medication management  *** I ordered medication including ***  for ***  Reevaluation of the patient after these medicines showed that the patient {resolved/improved/worsened:23923::"improved"} I have reviewed the patients home medicines and have made adjustments as needed   Social Determinants of Health:  ***   Test / Admission - Considered:  ***   {Document critical care time when appropriate:1} {Document review of labs and clinical decision tools ie heart  score, Chads2Vasc2 etc:1}  {Document your independent review of radiology images, and any outside records:1} {Document your discussion with family members, caretakers, and with consultants:1} {Document social determinants of health affecting pt's care:1} {Document your decision making why or why not admission, treatments were needed:1} Final Clinical Impression(s) / ED Diagnoses Final diagnoses:  None    Rx / DC Orders ED Discharge Orders     None

## 2022-03-29 ENCOUNTER — Other Ambulatory Visit: Payer: Self-pay

## 2022-03-29 ENCOUNTER — Encounter (HOSPITAL_COMMUNITY): Payer: Self-pay | Admitting: Internal Medicine

## 2022-03-29 DIAGNOSIS — N390 Urinary tract infection, site not specified: Secondary | ICD-10-CM | POA: Diagnosis present

## 2022-03-29 DIAGNOSIS — J121 Respiratory syncytial virus pneumonia: Secondary | ICD-10-CM | POA: Diagnosis not present

## 2022-03-29 LAB — URINALYSIS, ROUTINE W REFLEX MICROSCOPIC
Bilirubin Urine: NEGATIVE
Glucose, UA: NEGATIVE mg/dL
Ketones, ur: 5 mg/dL — AB
Nitrite: NEGATIVE
Protein, ur: 100 mg/dL — AB
RBC / HPF: >50 RBC/hpf — ABNORMAL HIGH (ref 0–5)
Renal Epithelial: 2
Specific Gravity, Urine: 1.023 (ref 1.005–1.030)
WBC, UA: >50 WBC/hpf — ABNORMAL HIGH (ref 0–5)
pH: 5 (ref 5.0–8.0)

## 2022-03-29 LAB — PROCALCITONIN: Procalcitonin: <0.1 ng/mL

## 2022-03-29 MED ORDER — POTASSIUM CHLORIDE CRYS ER 20 MEQ PO TBCR
40.0000 meq | EXTENDED_RELEASE_TABLET | Freq: Once | ORAL | Status: AC
  Administered 2022-03-29: 40 meq via ORAL
  Filled 2022-03-29: qty 2

## 2022-03-29 MED ORDER — ROSUVASTATIN CALCIUM 20 MG PO TABS
40.0000 mg | ORAL_TABLET | Freq: Every day | ORAL | Status: DC
  Administered 2022-03-29 – 2022-03-31 (×3): 40 mg via ORAL
  Filled 2022-03-29 (×3): qty 2

## 2022-03-29 MED ORDER — LACTATED RINGERS IV BOLUS
500.0000 mL | Freq: Once | INTRAVENOUS | Status: AC
  Administered 2022-03-29: 500 mL via INTRAVENOUS

## 2022-03-29 MED ORDER — IPRATROPIUM-ALBUTEROL 0.5-2.5 (3) MG/3ML IN SOLN
3.0000 mL | Freq: Once | RESPIRATORY_TRACT | Status: AC
  Administered 2022-03-29: 3 mL via RESPIRATORY_TRACT
  Filled 2022-03-29: qty 3

## 2022-03-29 MED ORDER — SODIUM CHLORIDE 0.9 % IV SOLN
1.0000 g | INTRAVENOUS | Status: DC
  Administered 2022-03-30 – 2022-03-31 (×2): 1 g via INTRAVENOUS
  Filled 2022-03-29 (×2): qty 10

## 2022-03-29 MED ORDER — TAMSULOSIN HCL 0.4 MG PO CAPS
0.4000 mg | ORAL_CAPSULE | Freq: Every day | ORAL | Status: DC
  Administered 2022-03-29 – 2022-03-31 (×3): 0.4 mg via ORAL
  Filled 2022-03-29 (×3): qty 1

## 2022-03-29 MED ORDER — ASPIRIN 81 MG PO TBEC
81.0000 mg | DELAYED_RELEASE_TABLET | Freq: Every day | ORAL | Status: DC
  Administered 2022-03-29 – 2022-03-31 (×3): 81 mg via ORAL
  Filled 2022-03-29 (×3): qty 1

## 2022-03-29 MED ORDER — LACTATED RINGERS IV SOLN: INTRAVENOUS | Status: DC

## 2022-03-29 MED ORDER — LEVOFLOXACIN IN D5W 750 MG/150ML IV SOLN
750.0000 mg | Freq: Once | INTRAVENOUS | Status: AC
  Administered 2022-03-29: 750 mg via INTRAVENOUS
  Filled 2022-03-29: qty 150

## 2022-03-29 MED ORDER — IPRATROPIUM-ALBUTEROL 0.5-2.5 (3) MG/3ML IN SOLN
3.0000 mL | Freq: Four times a day (QID) | RESPIRATORY_TRACT | Status: DC | PRN
  Administered 2022-03-29 (×2): 3 mL via RESPIRATORY_TRACT
  Filled 2022-03-29 (×2): qty 3

## 2022-03-29 MED ORDER — ENOXAPARIN SODIUM 40 MG/0.4ML IJ SOSY
40.0000 mg | PREFILLED_SYRINGE | INTRAMUSCULAR | Status: DC
  Administered 2022-03-29 – 2022-03-31 (×3): 40 mg via SUBCUTANEOUS
  Filled 2022-03-29 (×3): qty 0.4

## 2022-03-29 MED ORDER — METOPROLOL SUCCINATE ER 25 MG PO TB24
25.0000 mg | ORAL_TABLET | Freq: Every day | ORAL | Status: DC
  Administered 2022-03-29: 25 mg via ORAL
  Filled 2022-03-29 (×3): qty 1

## 2022-03-29 MED ORDER — CIPROFLOXACIN HCL 250 MG PO TABS
500.0000 mg | ORAL_TABLET | Freq: Two times a day (BID) | ORAL | Status: DC
  Administered 2022-03-29: 500 mg via ORAL
  Filled 2022-03-29: qty 2

## 2022-03-29 MED ORDER — NITROGLYCERIN 0.4 MG SL SUBL: 0.4000 mg | SUBLINGUAL_TABLET | SUBLINGUAL | Status: DC | PRN

## 2022-03-29 MED ORDER — TIZANIDINE HCL 4 MG PO TABS
4.0000 mg | ORAL_TABLET | ORAL | Status: DC
  Administered 2022-03-29 – 2022-03-31 (×5): 4 mg via ORAL
  Filled 2022-03-29 (×5): qty 1

## 2022-03-29 MED ORDER — DEXTROSE-NACL 5-0.45 % IV SOLN: INTRAVENOUS | Status: AC

## 2022-03-29 MED ORDER — METHYLPREDNISOLONE SODIUM SUCC 125 MG IJ SOLR
125.0000 mg | Freq: Once | INTRAMUSCULAR | Status: AC
  Administered 2022-03-29: 125 mg via INTRAVENOUS
  Filled 2022-03-29: qty 2

## 2022-03-29 MED ORDER — MIRABEGRON ER 25 MG PO TB24
25.0000 mg | ORAL_TABLET | Freq: Every day | ORAL | Status: DC
  Administered 2022-03-29 – 2022-03-30 (×2): 25 mg via ORAL
  Filled 2022-03-29 (×2): qty 1

## 2022-03-29 NOTE — Subjective & Objective (Signed)
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 He has increased difficulty moving recently and on the day of admission he fell and could not get up due to leg weakness. He does endorse chills, denies fever. He reports a cough productive of clear sputum w/o metallic taste and mild SOB. Due to his increased weakness he presents to [facility] for evaluation

## 2022-03-29 NOTE — Progress Notes (Signed)
Patient admitted to the hospital earlier this morning by Dr. Linda Hedges  Patient seen and examined.  He reports that he is feeling a little better since admission.  He does have a cough.  On minimal oxygen at this point and is being weaned off.  He was admitted to the hospital with RSV pneumonia.  Started on supportive therapy with bronchodilators, oxygen, mucolytic's.  Also concern for underlying UTI and is on antibiotics.  Follow-up culture.  Appears to be somewhat dehydrated, would continue IV fluids for now.  Request PT/OT.  Updated wife over the phone.  Barry Powers

## 2022-03-29 NOTE — ED Notes (Signed)
Daughter given update on pt

## 2022-03-29 NOTE — Assessment & Plan Note (Signed)
Patient with weakness, SOB, wheezing and on presentation to ED hypoxemia, worse with exertion. No leukocytosis, CXR-NAD, CTA chest - negative for PE, positive for infiltrate lingula and LLL, respiratory panel postive for RSV. Data and exam consistent with RSV (viral) pneumonia with hypoxemia.  Plan Obs admit to med-surg  Continue oxygen support - keep O2 sat > 90%  Check procalcitonin  Check RA O2 sat with ambulation daily  IV hydration  APAP for fever  PRN SABA (albuterol) for wheezing

## 2022-03-29 NOTE — Assessment & Plan Note (Addendum)
Stable with no c/o chest pain. EKG with 2 mm ST depression leads II, III suggesting inferior ischemia.  Plan Continue home regimen  Troponins x 2

## 2022-03-29 NOTE — ED Notes (Signed)
Resp in room

## 2022-03-29 NOTE — Progress Notes (Signed)
  Transition of Care [facility]) Screening Note   Patient Details  Name: Barry Powers Date of Birth: 28-Jul-1945   Transition of Care [facility]) CM/SW Contact:    Iona Beard, [place] Phone Number: 03/29/2022, 11:16 AM  PT/OT eval pending. TOC to follow for recommendations and discharge needs.   Transition of Care Department [facility]) has reviewed patient and no TOC needs have been identified at this time. We will continue to monitor patient advancement through interdisciplinary progression rounds. If new patient transition needs arise, please place a TOC consult.

## 2022-03-29 NOTE — ED Notes (Signed)
Called resp for neb  

## 2022-03-29 NOTE — H&P (Signed)
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 He has increased difficulty moving recently and on the day of admission he fell and could not get up due to leg weakness. He does endorse chills, denies fever. He reports a cough productive of clear sputum w/o metallic taste and mild SOB. Due to his increased weakness he presents to [facility] for evaluation   ED Course: T 98.3  140/72  HR 64  RR 23. Per EDP O2 sat in the 80's, improved on [place] oxygen. Lab: K 3.2, Cr 1.56 (baseline 1.33), BNP 112, CK total 153, Troponin 18. WBC 7.3 w/ 51/40/8, U/A cloudy urine, LE moderate, Bacteria rare, WBC >50/hpf. Imaging: CXR - NAD, CTA chest - no PE, infiltrate lingula/LLL, emphysema. Respiratory panel POSITIVE for RSV. Patient was continued on oxygen. He was given a dose of Levaquin in ED. TRH called to admit for management of PNA with hypoxemia.   Review of Systems:  Review of Systems  Constitutional:  Positive for chills, diaphoresis and malaise/fatigue. Negative for fever.  HENT:  Positive for hearing loss. Negative for congestion and sore throat.   Eyes: Negative.   Respiratory:  Positive for cough, sputum production, shortness of breath and wheezing.        Cough productive of clear phlegm.   Cardiovascular:  Negative for chest pain, palpitations and PND.  Gastrointestinal: Negative.   Genitourinary:  Positive for frequency. Negative for dysuria.  Musculoskeletal:  Positive for falls and myalgias.  Skin: Negative.   Neurological:        Intermittent weakness. Intermittent rigidity that interferes with  gait and balance.   Endo/Heme/Allergies:  Negative for polydipsia. Does not bruise/bleed easily.  Psychiatric/Behavioral: Negative.      Past Medical History:  Diagnosis Date   Coronary artery disease    Hypercholesteremia    Hypertension    NSTEMI (non-ST elevated myocardial infarction) [facility])     Past Surgical History:  Procedure Laterality Date   CERVICAL LAMINECTOMY     LUMBAR SPINE SURGERY      Soc Hx - married 22 years. He has one daughter and one grandson that live with him and his wife. His wife has CHF. He is a retired Radio producer.    reports that he has never smoked. He has never used smokeless tobacco. He reports that he does not drink alcohol and does not use drugs.  Allergies  Allergen Reactions   Penicillins Other (See Comments)    "Blacked-out"    Family History  Problem Relation Age of Onset   Alzheimer's disease Mother    Aneurysm Father    COPD Sister     Prior to Admission medications   Medication Sig Start Date End Date Taking? Authorizing Provider  aspirin EC 81 MG tablet Take 81 mg by mouth daily.   Yes [provider]  metoprolol succinate (TOPROL-XL) 25 MG 24 hr tablet Take 1 tablet (25 mg total) by mouth daily. 01/31/22  Yes Josue Hector, MD  nitroGLYCERIN (NITROSTAT) 0.4 MG SL tablet Place 1 tablet (0.4 mg total) under the tongue every 5 (five) minutes  as needed for chest pain. 01/31/22  Yes Josue Hector, MD  pravastatin (PRAVACHOL) 40 MG tablet Take 40 mg by mouth daily.   Yes [provider]  tamsulosin (FLOMAX) 0.4 MG CAPS capsule Take 1 capsule (0.4 mg total) by mouth daily. 02/28/22  Yes Alvira Monday, FNP  tiZANidine (ZANAFLEX) 4 MG capsule Take 1 capsule (4 mg total) by mouth in the morning and at bedtime. 02/28/22  Yes Alvira Monday, FNP  Vibegron (GEMTESA) 75 MG TABS Take 1 tablet by mouth daily.   Yes [provider]  oxybutynin (DITROPAN) 5 MG tablet Take 1 tablet (5 mg total) by mouth 2  (two) times daily. Patient not taking: Reported on 03/28/2022 02/28/22   Alvira Monday, FNP  rosuvastatin (CRESTOR) 40 MG tablet Take 1 tablet (40 mg total) by mouth daily. 01/31/22 01/26/23  Mallipeddi, Quenten Raven, MD    Physical Exam: Vitals:   03/29/22 0100 03/29/22 0130 03/29/22 0200 03/29/22 0400  BP: (!) 156/75 (!) 144/66 (!) 140/72   Pulse: 73 64 68 64  Resp: 14 (!) 22 18 (!) 23  Temp:      SpO2: 97% 96% 97% 93%  Weight:      Height:        Physical Exam Vitals and nursing note reviewed.  Constitutional:      General: He is not in acute distress.    Appearance: He is well-developed and normal weight. He is not ill-appearing.     Comments: HOH. Damp to touch. Very stiff through the torso and proximal LE  HENT:     Head: Normocephalic and atraumatic.     Mouth/Throat:     Mouth: Mucous membranes are moist.     Comments: Native dentition Eyes:     Extraocular Movements: Extraocular movements intact.     Pupils: Pupils are equal, round, and reactive to light.  Neck:     Thyroid: No thyromegaly.  Cardiovascular:     Rate and Rhythm: Normal rate and regular rhythm.     Pulses: Normal pulses.     Heart sounds: Normal heart sounds.  Pulmonary:     Effort: Pulmonary effort is normal. No accessory muscle usage or respiratory distress.     Breath sounds: Examination of the left-middle field reveals rhonchi. Examination of the left-lower field reveals rhonchi. Rhonchi present. No decreased breath sounds, wheezing or rales.  Chest:     Chest wall: No mass or tenderness.  Abdominal:     General: Bowel sounds are normal.     Palpations: Abdomen is soft. There is no mass.  Musculoskeletal:     Cervical back: Normal range of motion.     Right lower leg: No edema.     Left lower leg: No edema.  Skin:    General: Skin is warm and dry.  Neurological:     General: No focal deficit present.     Mental Status: He is alert and oriented to person, place, and time.  Psychiatric:         Mood and Affect: Mood normal.      Labs on Admission: I have personally reviewed following labs and imaging studies  CBC: Recent Labs  Lab 03/28/22 2015  WBC 7.3  NEUTROABS 3.7  HGB 14.2  HCT 43.0  MCV 87.2  PLT 629*   Basic Metabolic Panel: Recent Labs  Lab 03/28/22 2015  NA 137  K 3.2*  CL 105  CO2 24  GLUCOSE 127*  BUN 14  CREATININE 1.56*  CALCIUM 8.3*  MG 2.1   GFR: Estimated Creatinine Clearance: 40.9 mL/min (A) (by C-G formula based on SCr of 1.56 mg/dL (H)). Liver Function Tests: Recent Labs  Lab 03/28/22 2015  AST 18  ALT 12  ALKPHOS 42  BILITOT 0.9  PROT 6.6  ALBUMIN 3.5   No results for input(s): "LIPASE", "AMYLASE" in the last 168 hours. No results for input(s): "AMMONIA" in the last 168 hours. Coagulation Profile: No results for input(s): "INR", "PROTIME" in the last 168 hours. Cardiac Enzymes: Recent Labs  Lab 03/28/22 2225  CKTOTAL 153   BNP (last 3 results) No results for input(s): "PROBNP" in the last 8760 hours. HbA1C: No results for input(s): "HGBA1C" in the last 72 hours. CBG: No results for input(s): "GLUCAP" in the last 168 hours. Lipid Profile: No results for input(s): "CHOL", "HDL", "LDLCALC", "TRIG", "CHOLHDL", "LDLDIRECT" in the last 72 hours. Thyroid Function Tests: No results for input(s): "TSH", "T4TOTAL", "FREET4", "T3FREE", "THYROIDAB" in the last 72 hours. Anemia Panel: No results for input(s): "VITAMINB12", "FOLATE", "FERRITIN", "TIBC", "IRON", "RETICCTPCT" in the last 72 hours. Urine analysis:    Component Value Date/Time   COLORURINE AMBER (A) 03/29/2022 0025   APPEARANCEUR CLOUDY (A) 03/29/2022 0025   APPEARANCEUR Cloudy (A) 03/14/2022 0943   LABSPEC 1.023 03/29/2022 0025   PHURINE 5.0 03/29/2022 0025   GLUCOSEU NEGATIVE 03/29/2022 0025   HGBUR MODERATE (A) 03/29/2022 0025   BILIRUBINUR NEGATIVE 03/29/2022 0025   BILIRUBINUR Negative 03/14/2022 0943   KETONESUR 5 (A) 03/29/2022 0025   PROTEINUR  100 (A) 03/29/2022 0025   NITRITE NEGATIVE 03/29/2022 0025   LEUKOCYTESUR MODERATE (A) 03/29/2022 0025    Radiological Exams on Admission: I have personally reviewed images CT Angio Chest PE W and/or Wo Contrast  Result Date: 03/28/2022 CLINICAL DATA:  Spastic paralysis, evaluate for PE EXAM: CT ANGIOGRAPHY CHEST WITH CONTRAST TECHNIQUE: Multidetector CT imaging of the chest was performed using the standard protocol during bolus administration of intravenous contrast. Multiplanar CT image reconstructions and MIPs were obtained to evaluate the vascular anatomy. RADIATION DOSE REDUCTION: This exam was performed according to the departmental dose-optimization program which includes automated exposure control, adjustment of the mA and/or kV according to patient size and/or use of iterative reconstruction technique. CONTRAST:  41m OMNIPAQUE IOHEXOL 350 MG/ML SOLN COMPARISON:  Chest radiograph dated 03/28/2022 FINDINGS: Cardiovascular: Satisfactory opacification of the bilateral pulmonary arteries to the segmental level. No evidence of pulmonary embolism. No evidence of thoracic aortic aneurysm or dissection. Mild atherosclerotic calcifications of the aortic arch. Mild coronary is of the LAD. Mediastinum/Nodes: No suspicious mediastinal lymphadenopathy. Visualized thyroid is unremarkable. Lungs/Pleura: Evaluation lung parenchyma is constrained by respiratory motion. Within that constraint, there are no suspicious pulmonary nodules. Mild centrilobular emphysematous changes, upper lobe predominant. Faint tree-in-bud nodularity in the lingula and left lower lobe (series 6/image 60), suggesting mild infection/pneumonia. Trace bilateral pleural effusions.  No pneumothorax. Upper Abdomen: Visualized upper abdomen is notable for cholelithiasis, without associated inflammatory changes. Musculoskeletal: Visualized osseous structures are within normal limits. Review of the MIP images confirms the above findings.  IMPRESSION: No evidence of pulmonary embolism. Mild infection/pneumonia in the lingula and left lower lobe. Trace bilateral pleural effusions. Aortic Atherosclerosis (ICD10-I70.0) and Emphysema (ICD10-J43.9). Electronically Signed   By: SJulian HyM.D.   On: 03/28/2022 23:54   DG Hip Unilat W or Wo Pelvis 2-3 Views Left  Result Date: 03/28/2022 CLINICAL DATA:  Cough.  Falls, legs giving out. EXAM: DG HIP (WITH OR WITHOUT PELVIS) 2-3V LEFT COMPARISON:  None Available. FINDINGS: There is no evidence of hip fracture or dislocation. Degenerative changes are noted in the lower lumbar spine. Vascular calcifications are noted in the pelvis. IMPRESSION: No acute fracture or dislocation. Electronically Signed   By: Brett Fairy M.D.   On: 03/28/2022 20:21   DG Chest Port 1 View  Result Date: 03/28/2022 CLINICAL DATA:  Cough EXAM: PORTABLE CHEST 1 VIEW COMPARISON:  None Available. FINDINGS: The heart size and mediastinal contours are within normal limits. Is no lung consolidation, pleural effusion or pneumothorax. There is linear scarring or atelectasis in the left lung base. The visualized skeletal structures are unremarkable. IMPRESSION: No active disease. Electronically Signed   By: Ronney Asters M.D.   On: 03/28/2022 20:19    EKG: I have personally reviewed EKG: Sinus rhythm, 2 mm ST depression II, III suggestive of ischemia  Assessment/Plan Principal Problem:   Pneumonia due to respiratory syncytial virus (RSV) Active Problems:   UTI (urinary tract infection)   Hypertension   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HCC)   Coronary artery 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    Assessment and Plan: * Pneumonia due to respiratory syncytial virus (RSV) Patient with weakness, SOB, wheezing and on presentation to ED hypoxemia, worse with exertion. No leukocytosis, CXR-NAD, CTA chest - negative for PE, positive for infiltrate lingula and LLL, respiratory  panel postive for RSV. Data and exam consistent with RSV (viral) pneumonia with hypoxemia.  Plan Obs admit to med-surg  Continue oxygen support - keep O2 sat > 90%  Check procalcitonin  Check RA O2 sat with ambulation daily  IV hydration  APAP for fever  PRN SABA (albuterol) for wheezing  UTI (urinary tract infection) Patient reports increased frequency with decreased urine volume over the several days PTA. U/A positive with rare bacteria and > 50 WBC/hpf.  Plan Cipro 500 mg bid x 5  Hypertension BP mildly elevated.  Plan Continue home medication  Coronary artery 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 Stable with no c/o chest pain. EKG with 2 mm ST depression leads II, III suggesting inferior ischemia.  Plan Continue home regimen  Troponins x 2       DVT prophylaxis: Lovenox Code Status: Full Code Family Communication: deferred to later in the day  Disposition Plan: home 24-48 hrs  Consults called:   Admission status: Observation, Med-Surg   Adella Hare, MD [facility] 03/29/2022, 5:57 AM

## 2022-03-29 NOTE — Assessment & Plan Note (Signed)
BP mildly elevated.  Plan Continue home medication

## 2022-03-29 NOTE — Assessment & Plan Note (Signed)
Patient reports increased frequency with decreased urine volume over the several days PTA. U/A positive with rare bacteria and > 50 WBC/hpf.  Plan Cipro 500 mg bid x 5

## 2022-03-29 NOTE — ED Notes (Signed)
Hospitalist in room.

## 2022-03-30 DIAGNOSIS — Z1152 Encounter for screening for COVID-19: Secondary | ICD-10-CM | POA: Diagnosis not present

## 2022-03-30 DIAGNOSIS — I252 Old myocardial infarction: Secondary | ICD-10-CM | POA: Diagnosis not present

## 2022-03-30 DIAGNOSIS — Z820 Family history of epilepsy and other diseases of the nervous system: Secondary | ICD-10-CM | POA: Diagnosis not present

## 2022-03-30 DIAGNOSIS — G114 Hereditary spastic paraplegia: Secondary | ICD-10-CM | POA: Diagnosis present

## 2022-03-30 DIAGNOSIS — N179 Acute kidney failure, unspecified: Secondary | ICD-10-CM | POA: Diagnosis present

## 2022-03-30 DIAGNOSIS — E7800 Pure hypercholesterolemia, unspecified: Secondary | ICD-10-CM | POA: Diagnosis present

## 2022-03-30 DIAGNOSIS — Z79899 Other long term (current) drug therapy: Secondary | ICD-10-CM | POA: Diagnosis not present

## 2022-03-30 DIAGNOSIS — Z7982 Long term (current) use of aspirin: Secondary | ICD-10-CM | POA: Diagnosis not present

## 2022-03-30 DIAGNOSIS — J121 Respiratory syncytial virus pneumonia: Secondary | ICD-10-CM | POA: Diagnosis present

## 2022-03-30 DIAGNOSIS — Z825 Family history of asthma and other chronic lower respiratory diseases: Secondary | ICD-10-CM | POA: Diagnosis not present

## 2022-03-30 DIAGNOSIS — R062 Wheezing: Secondary | ICD-10-CM | POA: Diagnosis present

## 2022-03-30 DIAGNOSIS — I2510 Atherosclerotic heart disease of native coronary artery without angina pectoris: Secondary | ICD-10-CM | POA: Diagnosis present

## 2022-03-30 DIAGNOSIS — N390 Urinary tract infection, site not specified: Secondary | ICD-10-CM

## 2022-03-30 DIAGNOSIS — E860 Dehydration: Secondary | ICD-10-CM | POA: Diagnosis present

## 2022-03-30 DIAGNOSIS — I10 Essential (primary) hypertension: Secondary | ICD-10-CM | POA: Diagnosis present

## 2022-03-30 DIAGNOSIS — J129 Viral pneumonia, unspecified: Secondary | ICD-10-CM | POA: Diagnosis present

## 2022-03-30 DIAGNOSIS — R0902 Hypoxemia: Secondary | ICD-10-CM | POA: Diagnosis present

## 2022-03-30 DIAGNOSIS — K5900 Constipation, unspecified: Secondary | ICD-10-CM | POA: Diagnosis present

## 2022-03-30 LAB — CBC
HCT: 41.1 % (ref 39.0–52.0)
Hemoglobin: 13.6 g/dL (ref 13.0–17.0)
MCH: 28.8 pg (ref 26.0–34.0)
MCHC: 33.1 g/dL (ref 30.0–36.0)
MCV: 86.9 fL (ref 80.0–100.0)
Platelets: 140 10*3/uL — ABNORMAL LOW (ref 150–400)
RBC: 4.73 MIL/uL (ref 4.22–5.81)
RDW: 15.5 % (ref 11.5–15.5)
WBC: 14.5 10*3/uL — ABNORMAL HIGH (ref 4.0–10.5)
nRBC: 0 % (ref 0.0–0.2)

## 2022-03-30 LAB — BASIC METABOLIC PANEL
Anion gap: 7 (ref 5–15)
BUN: 17 mg/dL (ref 8–23)
CO2: 23 mmol/L (ref 22–32)
Calcium: 8.6 mg/dL — ABNORMAL LOW (ref 8.9–10.3)
Chloride: 108 mmol/L (ref 98–111)
Creatinine, Ser: 1.24 mg/dL (ref 0.61–1.24)
GFR, Estimated: >60 mL/min (ref >60)
Glucose, Bld: 111 mg/dL — ABNORMAL HIGH (ref 70–99)
Potassium: 5.1 mmol/L (ref 3.5–5.1)
Sodium: 138 mmol/L (ref 135–145)

## 2022-03-30 LAB — MAGNESIUM: Magnesium: 2.2 mg/dL (ref 1.7–2.4)

## 2022-03-30 MED ORDER — BISACODYL 10 MG RE SUPP
10.0000 mg | Freq: Once | RECTAL | Status: DC
  Filled 2022-03-30: qty 1

## 2022-03-30 NOTE — Evaluation (Signed)
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 He has increased difficulty moving recently and on the day of admission he fell and could not get up due to leg weakness. He does endorse chills, denies fever. He reports a cough productive of clear sputum w/o metallic taste and mild SOB. Due to his increased weakness he presents to [facility] for evaluation   Clinical Impression  Patient functioning near baseline for functional mobility and gait demonstrating good return for bed mobility, transferring to chair and ambulating in room/hallway without loss of balance.  Patient limited mostly due to fatigue and tolerated sitting up in chair after therapy.  Patient will benefit from continued skilled physical therapy in hospital and recommended venue below to increase strength, balance, endurance for safe ADLs and gait.         Recommendations for follow up therapy are one component of a multi-disciplinary discharge planning process, led by the attending physician.  Recommendations may be updated based on patient status, additional functional criteria and insurance authorization.  Follow Up Recommendations Home health PT      Assistance Recommended at Discharge Set up Supervision/Assistance  Patient can return home with the following  A little help with walking and/or transfers;A little help with bathing/dressing/bathroom;Help with stairs or ramp for entrance;Assistance with cooking/housework    Equipment Recommendations None recommended by PT  Recommendations for Other Services       Functional Status Assessment Patient has had a recent decline in their functional status and demonstrates the ability to make significant  improvements in function in a reasonable and predictable amount of time.     Precautions / Restrictions Precautions Precautions: Fall Restrictions Weight Bearing Restrictions: No      Mobility  Bed Mobility Overal bed mobility: Needs Assistance Bed Mobility: Supine to Sit     Supine to sit: Supervision     General bed mobility comments: increased time, slightly labored movement    Transfers Overall transfer level: Needs assistance Equipment used: Rolling walker (2 wheels) Transfers: Sit to/from Stand, Bed to chair/wheelchair/BSC Sit to Stand: Supervision, Min guard   Step pivot transfers: Supervision, Min guard       General transfer comment: increased time, slightly labored movement    Ambulation/Gait Ambulation/Gait assistance: Supervision, Min guard Gait Distance (Feet): 75 Feet Assistive device: Rolling walker (2 wheels) Gait Pattern/deviations: Decreased step length - right, Decreased step length - left, Decreased stride length Gait velocity: decreased     General Gait Details: slightly labored cadence with good return for ambulation in room and hallway without loss of balance, limited mostly due to fatigue  Stairs            Wheelchair Mobility    Modified Rankin (Stroke Patients Only)       Balance Overall balance assessment: Needs assistance Sitting-balance support: Feet supported, No upper extremity supported Sitting balance-Leahy Scale: Good Sitting balance - Comments: seated at EOB   Standing balance support: During functional activity, Bilateral upper extremity supported Standing balance-Leahy Scale: Fair Standing balance comment: fair/good using RW                             Pertinent Vitals/Pain Pain Assessment Pain Assessment: No/denies pain  Home Living Family/patient expects to be discharged to:: Private residence Living Arrangements: Spouse/significant other;Children Available Help at Discharge:  Family;Available PRN/intermittently Type of Home: House Home Access: Stairs to enter Entrance Stairs-Rails: None Entrance Stairs-Number of Steps: 3   Home Layout: One level Home Equipment: Conservation officer, nature (2 wheels);Wheelchair - manual;Cane - single point      Prior Function Prior Level of Function : Independent/Modified Independent;Driving             Mobility Comments: household and short distanced community ambulator using RW, drives, uses electric carts in stores ADLs Comments: Independent     Hand Dominance        Extremity/Trunk Assessment   Upper Extremity Assessment Upper Extremity Assessment: Overall WFL for tasks assessed    Lower Extremity Assessment Lower Extremity Assessment: Generalized weakness    Cervical / Trunk Assessment Cervical / Trunk Assessment: Normal  Communication   Communication: No difficulties  Cognition Arousal/Alertness: Awake/alert Behavior During Therapy: WFL for tasks assessed/performed Overall Cognitive Status: Within Functional Limits for tasks assessed                                          General Comments      Exercises     Assessment/Plan    PT Assessment Patient needs continued PT services  PT Problem List Decreased strength;Decreased activity tolerance;Decreased balance;Decreased mobility       PT Treatment Interventions DME instruction;Gait training;Stair training;Functional mobility training;Therapeutic activities;Patient/family education;Therapeutic exercise;Balance training    PT Goals (Current goals can be found in the Care Plan section)  Acute Rehab PT Goals Patient Stated Goal: return home with family to assist PT Goal Formulation: With patient Time For Goal Achievement: 04/04/22 Potential to Achieve Goals: Good    Frequency Min 3X/week     Co-evaluation               AM-PAC PT "6 Clicks" Mobility  Outcome Measure Help needed turning from your back to your side while in  a flat bed without using bedrails?: None Help needed moving from lying on your back to sitting on the side of a flat bed without using bedrails?: A Little Help needed moving to and from a bed to a chair (including a wheelchair)?: A Little Help needed standing up from a chair using your arms (e.g., wheelchair or bedside chair)?: A Little Help needed to walk in hospital room?: A Little Help needed climbing 3-5 steps with a railing? : A Little 6 Click Score: 19    End of Session   Activity Tolerance: Patient tolerated treatment well;Patient limited by fatigue Patient left: in chair;with call bell/phone within reach Nurse Communication: Mobility status PT Visit Diagnosis: Unsteadiness on feet (R26.81);Other abnormalities of gait and mobility (R26.89);Muscle weakness (generalized) (M62.81)    Time: 8315-1761 PT Time Calculation (min) (ACUTE ONLY): 31 min   Charges:   PT Evaluation $PT Eval Moderate Complexity: 1 Mod PT Treatments $Therapeutic Activity: 23-37 mins        2:11 PM, 03/30/22 Lonell Grandchild, MPT Physical Therapist with [facility] [phone] office [phone] mobile phone

## 2022-03-30 NOTE — Progress Notes (Signed)
Patient slept on and off this shift. No complaint of pain but did stated "I'm having more urgency with urination." Continued to monitor.

## 2022-03-30 NOTE — Care Management Obs Status (Signed)
[place] NOTIFICATION   Patient Details  Name: Barry Powers MRN: [id number] Date of Birth: 06-15-1945   Medicare Observation Status Notification Given:  Yes (mailed t o address on file)    Tommy Medal 03/30/2022, 4:52 PM

## 2022-03-30 NOTE — Plan of Care (Signed)
  Problem: Acute Rehab PT Goals(only PT should resolve) Goal: Pt Will Go Supine/Side To Sit Outcome: Progressing Flowsheets (Taken 03/30/2022 1411) Pt will go Supine/Side to Sit:  Independently  with modified independence Goal: Patient Will Transfer Sit To/From Stand Outcome: Progressing Flowsheets (Taken 03/30/2022 1411) Patient will transfer sit to/from stand:  Independently  with modified independence Goal: Pt Will Transfer Bed To Chair/Chair To Bed Outcome: Progressing Flowsheets (Taken 03/30/2022 1411) Pt will Transfer Bed to Chair/Chair to Bed:  with modified independence  with supervision Goal: Pt Will Ambulate Outcome: Progressing Flowsheets (Taken 03/30/2022 1411) Pt will Ambulate:  100 feet  with modified independence  with supervision  with rolling walker   2:12 PM, 03/30/22 Lonell Grandchild, MPT Physical Therapist with [facility] [phone] office [phone] mobile phone

## 2022-03-30 NOTE — TOC Initial Note (Signed)
Transition of Care [facility]) - Initial/Assessment Note    Patient Details  Name: Barry Powers MRN: [id number] Date of Birth: 11-08-1945  Transition of Care [facility]) CM/SW Contact:    Iona Beard, [place] Phone Number: 03/30/2022, 12:23 PM  Clinical Narrative:                 TOC updated that PT is recommending [facility] for pt at D/C. CSW spoke with pt about interest in referral being made. Pt requested that CSW speak with his daughter who was also in room. CSW spoke with pts daughter Otila Kluver about PT recommendation. Pt and daughter are agreeable to [facility] being set up prior to D/C. CSW inquired about [facility] agency preference, they do not have a preference. TOC to make [facility] referral prior to D/C. TOC to follow.   Planned Disposition: Home with Health Care Svc Barriers to Discharge: Continued Medical Work up   Patient Goals and CMS Choice Patient states their goals for this hospitalization and ongoing recovery are:: return home CMS Medicare.gov Compare Post Acute Care list provided to:: Patient Choice offered to / list presented to : Patient      Expected Discharge Plan and Services Planned Disposition: Home with Health Care Svc In-house Referral: Clinical Social Work Discharge Planning Services: CM Consult Post Acute Care Choice: [place] arrangements for the past 2 months: Single Family Home                                      Prior Living Arrangements/Services Living arrangements for the past 2 months: Single Family Home Lives with:: Spouse Patient language and need for interpreter reviewed:: Yes Do you feel safe going back to the place where you live?: Yes      Need for Family Participation in Patient Care: Yes (Comment) Care giver support system in place?: Yes (comment)   Criminal Activity/Legal Involvement Pertinent to Current Situation/Hospitalization: No - Comment as needed  Activities of Daily Living Home Assistive Devices/Equipment: Gilford Rile (specify type) ADL  Screening (condition at time of admission) Patient's cognitive ability adequate to safely complete daily activities?: Yes Is the patient deaf or have difficulty hearing?: Yes Does the patient have difficulty seeing, even when wearing glasses/contacts?: No Does the patient have difficulty concentrating, remembering, or making decisions?: No Patient able to express need for assistance with ADLs?: Yes Does the patient have difficulty dressing or bathing?: No Independently performs ADLs?: Yes (appropriate for developmental age) Does the patient have difficulty walking or climbing stairs?: Yes Weakness of Legs: Both Weakness of Arms/Hands: None  Permission Sought/Granted                  Emotional Assessment Appearance:: Appears stated age Attitude/Demeanor/Rapport: Engaged Affect (typically observed): Accepting Orientation: : Oriented to Self, Oriented to Place, Oriented to  Time, Oriented to Situation Alcohol / Substance Use: Not Applicable Psych Involvement: No (comment)  Admission diagnosis:  Wheezing [R06.2] Generalized weakness [R53.1] Fall, initial encounter [W19.XXXA] Pneumonia due to respiratory syncytial virus (RSV) [J12.1] Pneumonia of left lower lobe due to infectious organism [J18.9] Fatigue, unspecified type [R53.83] Patient Active Problem List   Diagnosis Date Noted   Pneumonia due to respiratory syncytial virus (RSV) 03/29/2022   UTI (urinary tract infection) 03/29/2022   Lower urinary tract symptoms (LUTS) 02/28/2022   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[place]) 02/28/2022   Coronary artery disease involving native coronary artery of native heart without  angina pectoris 01/31/2022   HLD (hyperlipidemia) 01/31/2022   Hypertension    PCP:  Alvira Monday, [place] Pharmacy:   [facility] [address] [place] - [place] [id number] [place] [address] Phone: [phone] Fax: [phone]     Social Determinants of Health (SDOH) Social  History: SDOH Screenings   Food Insecurity: No Food Insecurity (03/29/2022)  Housing: Low Risk  (03/29/2022)  Transportation Needs: No Transportation Needs (03/29/2022)  Utilities: Not At Risk (03/29/2022)  Depression (PHQ2-9): Medium Risk (02/28/2022)  Tobacco Use: Low Risk  (03/29/2022)   SDOH Interventions:     Readmission Risk Interventions     No data to display

## 2022-03-30 NOTE — Progress Notes (Signed)
PROGRESS NOTE    Barry Powers  SWN:[id number] DOB: 1945-06-25 DOA: 03/28/2022 PCP: Alvira Monday, FNP    Brief Narrative:  76 year old male with a history of retrospective paraplegia, admitted to the hospital with dehydration, acute kidney injury and pneumonia due to RSV.  Treated with steroids and bronchodilators for initial wheezing and shortness of breath.  Also felt to have possible UTI and started on antibiotics.  Urine culture in process.   Assessment & Plan:   Principal Problem:   Pneumonia due to respiratory syncytial virus (RSV) Active Problems:   UTI (urinary tract infection)   Hypertension   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HCC)   Coronary artery 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   Viral pneumonia   RSV pneumonia -Treat supportively with bronchodilators, pulmonary hygiene -He is able to wean off of oxygen  Dehydration Acute kidney injury -Overall renal function has improved with IV fluids  Urinary tract infection -UA indicative of possible infection -Currently on ceftriaxone -Follow-up culture  Constipation -Will provide suppository  Generalized weakness Hereditary spastic paraplegia -Likely exacerbated by underlying infectious process/dehydration -Seen by PT with recommendations for home health   DVT prophylaxis: enoxaparin (LOVENOX) injection 40 mg Start: 03/29/22 1000  Code Status: Full code Family Communication: Updated patient's wife Disposition Plan: Status is: Inpatient Remains inpatient appropriate because: Continued IV antibiotics pending culture data.  Possible discharge home tomorrow if improved     Consultants:    Procedures:    Antimicrobials:  Ceftriaxone 12/20 >   Subjective: Reports significant constipation.  Having trouble emptying his bladder, feels pressure.  Also has dysuria.  Overall feels that cough is a little less productive.  Objective: Vitals:   03/29/22 2129  03/30/22 0511 03/30/22 1304 03/30/22 2018  BP: (!) 147/79 111/66 (!) 93/53 (!) 151/82  Pulse: 79 (!) 52 (!) 51 (!) 58  Resp: '16 20  18  '$ Temp: 98.3 F (36.8 C) (!) 96.6 F (35.9 C) 98.3 F (36.8 C) (!) 97.3 F (36.3 C)  TempSrc:    Oral  SpO2: 98% 98% 97% 98%  Weight:      Height:        Intake/Output Summary (Last 24 hours) at 03/30/2022 2049 Last data filed at 03/30/2022 1700 Gross per 24 hour  Intake 1267.15 ml  Output 1200 ml  Net 67.15 ml   Filed Weights   03/28/22 1944  Weight: 76.2 kg    Examination:  General exam: Appears calm and comfortable  Respiratory system: Clear to auscultation. Respiratory effort normal. Cardiovascular system: S1 & S2 heard, RRR. No JVD, murmurs, rubs, gallops or clicks. No pedal edema. Gastrointestinal system: Abdomen is nondistended, soft and nontender. No organomegaly or masses felt. Normal bowel sounds heard. Central nervous system: Alert and oriented. No focal neurological deficits. Extremities: Symmetric 5 x 5 power. Skin: No rashes, lesions or ulcers Psychiatry: Judgement and insight appear normal. Mood & affect appropriate.     Data Reviewed: I have personally reviewed following labs and imaging studies  CBC: Recent Labs  Lab 03/28/22 2015 03/30/22 0334  WBC 7.3 14.5*  NEUTROABS 3.7  --   HGB 14.2 13.6  HCT 43.0 41.1  MCV 87.2 86.9  PLT 146* 938*   Basic Metabolic Panel: Recent Labs  Lab 03/28/22 2015 03/30/22 0334  NA 137 138  K 3.2* 5.1  CL 105 108  CO2 24 23  GLUCOSE 127* 111*  BUN 14 17  CREATININE 1.56* 1.24  CALCIUM 8.3* 8.6*  MG 2.1  2.2   GFR: Estimated Creatinine Clearance: 51.5 mL/min (by C-G formula based on SCr of 1.24 mg/dL). Liver Function Tests: Recent Labs  Lab 03/28/22 2015  AST 18  ALT 12  ALKPHOS 42  BILITOT 0.9  PROT 6.6  ALBUMIN 3.5   No results for input(s): "LIPASE", "AMYLASE" in the last 168 hours. No results for input(s): "AMMONIA" in the last 168 hours. Coagulation  Profile: No results for input(s): "INR", "PROTIME" in the last 168 hours. Cardiac Enzymes: Recent Labs  Lab 03/28/22 2225  CKTOTAL 153   BNP (last 3 results) No results for input(s): "PROBNP" in the last 8760 hours. HbA1C: No results for input(s): "HGBA1C" in the last 72 hours. CBG: No results for input(s): "GLUCAP" in the last 168 hours. Lipid Profile: No results for input(s): "CHOL", "HDL", "LDLCALC", "TRIG", "CHOLHDL", "LDLDIRECT" in the last 72 hours. Thyroid Function Tests: No results for input(s): "TSH", "T4TOTAL", "FREET4", "T3FREE", "THYROIDAB" in the last 72 hours. Anemia Panel: No results for input(s): "VITAMINB12", "FOLATE", "FERRITIN", "TIBC", "IRON", "RETICCTPCT" in the last 72 hours. Sepsis Labs: Recent Labs  Lab 03/29/22 0500  PROCALCITON <0.10    Recent Results (from the past 240 hour(s))  Resp panel by RT-PCR (RSV, Flu A&B, Covid) Anterior Nasal Swab     Status: Abnormal   Collection Time: 03/28/22  8:12 PM   Specimen: Anterior Nasal Swab  Result Value Ref Range Status   SARS Coronavirus 2 by RT PCR NEGATIVE NEGATIVE Final    Comment: (NOTE) SARS-CoV-2 target nucleic acids are NOT DETECTED.  The SARS-CoV-2 RNA is generally detectable in upper respiratory specimens during the acute phase of infection. The lowest concentration of SARS-CoV-2 viral copies this assay can detect is 138 copies/mL. A negative result does not preclude SARS-Cov-2 infection and should not be used as the sole basis for treatment or other patient management decisions. A negative result may occur with  improper specimen collection/handling, submission of specimen other than nasopharyngeal swab, presence of viral mutation(s) within the areas targeted by this assay, and inadequate number of viral copies(<138 copies/mL). A negative result must be combined with clinical observations, patient history, and epidemiological information. The expected result is Negative.  Fact Sheet for  Patients:  EntrepreneurPulse.com.au  Fact Sheet for Healthcare Providers:  IncredibleEmployment.be  This test is no t yet approved or cleared by the [place] FDA and  has been authorized for detection and/or diagnosis of SARS-CoV-2 by FDA under an Emergency Use Authorization (EUA). This EUA will remain  in effect (meaning this test can be used) for the duration of the COVID-19 declaration under Section 564(b)(1) of the Act, 21 U.S.C.section 360bbb-3(b)(1), unless the authorization is terminated  or revoked sooner.       Influenza A by PCR NEGATIVE NEGATIVE Final   Influenza B by PCR NEGATIVE NEGATIVE Final    Comment: (NOTE) The Xpert Xpress SARS-CoV-2/FLU/RSV plus assay is intended as an aid in the diagnosis of influenza from Nasopharyngeal swab specimens and should not be used as a sole basis for treatment. Nasal washings and aspirates are unacceptable for Xpert Xpress SARS-CoV-2/FLU/RSV testing.  Fact Sheet for Patients: EntrepreneurPulse.com.au  Fact Sheet for Healthcare Providers: IncredibleEmployment.be  This test is not yet approved or cleared by the [place] FDA and has been authorized for detection and/or diagnosis of SARS-CoV-2 by FDA under an Emergency Use Authorization (EUA). This EUA will remain in effect (meaning this test can be used) for the duration of the COVID-19 declaration under Section 564(b)(1) of the Act,  21 U.S.C. section 360bbb-3(b)(1), unless the authorization is terminated or revoked.     Resp Syncytial Virus by PCR POSITIVE (A) NEGATIVE Final    Comment: (NOTE) Fact Sheet for Patients: EntrepreneurPulse.com.au  Fact Sheet for Healthcare Providers: IncredibleEmployment.be  This test is not yet approved or cleared by the [place] FDA and has been authorized for detection and/or diagnosis of SARS-CoV-2 by FDA under an  Emergency Use Authorization (EUA). This EUA will remain in effect (meaning this test can be used) for the duration of the COVID-19 declaration under Section 564(b)(1) of the Act, 21 U.S.C. section 360bbb-3(b)(1), unless the authorization is terminated or revoked.  Performed at [facility], [address] [place], [place] [address]   Blood culture (routine x 2)     Status: None (Preliminary result)   Collection Time: 03/29/22 12:38 AM   Specimen: BLOOD RIGHT ARM  Result Value Ref Range Status   Specimen Description BLOOD RIGHT ARM  Final   Special Requests   Final    BOTTLES DRAWN AEROBIC AND ANAEROBIC Blood Culture adequate volume   Culture   Final    NO GROWTH 1 DAY Performed at [facility], [address], [place], [place] [address]    Report Status PENDING  Incomplete  Blood culture (routine x 2)     Status: None (Preliminary result)   Collection Time: 03/29/22 12:39 AM   Specimen: BLOOD  Result Value Ref Range Status   Specimen Description BLOOD BLOOD RIGHT HAND  Final   Special Requests   Final    BOTTLES DRAWN AEROBIC AND ANAEROBIC Blood Culture adequate volume   Culture   Final    NO GROWTH 1 DAY Performed at [facility], [address] [place], [place] [address]    Report Status PENDING  Incomplete         Radiology Studies: CT Angio Chest PE W and/or Wo Contrast  Result Date: 03/28/2022 CLINICAL DATA:  Spastic paralysis, evaluate for PE EXAM: CT ANGIOGRAPHY CHEST WITH CONTRAST TECHNIQUE: Multidetector CT imaging of the chest was performed using the standard protocol during bolus administration of intravenous contrast. Multiplanar CT image reconstructions and MIPs were obtained to evaluate the vascular anatomy. RADIATION DOSE REDUCTION: This exam was performed according to the departmental dose-optimization program which includes automated exposure control, adjustment of the mA and/or kV according to patient size and/or use of iterative reconstruction technique.  CONTRAST:  68m OMNIPAQUE IOHEXOL 350 MG/ML SOLN COMPARISON:  Chest radiograph dated 03/28/2022 FINDINGS: Cardiovascular: Satisfactory opacification of the bilateral pulmonary arteries to the segmental level. No evidence of pulmonary embolism. No evidence of thoracic aortic aneurysm or dissection. Mild atherosclerotic calcifications of the aortic arch. Mild coronary is of the LAD. Mediastinum/Nodes: No suspicious mediastinal lymphadenopathy. Visualized thyroid is unremarkable. Lungs/Pleura: Evaluation lung parenchyma is constrained by respiratory motion. Within that constraint, there are no suspicious pulmonary nodules. Mild centrilobular emphysematous changes, upper lobe predominant. Faint tree-in-bud nodularity in the lingula and left lower lobe (series 6/image 60), suggesting mild infection/pneumonia. Trace bilateral pleural effusions.  No pneumothorax. Upper Abdomen: Visualized upper abdomen is notable for cholelithiasis, without associated inflammatory changes. Musculoskeletal: Visualized osseous structures are within normal limits. Review of the MIP images confirms the above findings. IMPRESSION: No evidence of pulmonary embolism. Mild infection/pneumonia in the lingula and left lower lobe. Trace bilateral pleural effusions. Aortic Atherosclerosis (ICD10-I70.0) and Emphysema (ICD10-J43.9). Electronically Signed   By: SJulian HyM.D.   On: 03/28/2022 23:54        Scheduled Meds:  aspirin EC  81 mg Oral Daily   bisacodyl  10 mg Rectal Once   enoxaparin (LOVENOX) injection  40 mg Subcutaneous Q24H   metoprolol succinate  25 mg Oral Daily   rosuvastatin  40 mg Oral Daily   tamsulosin  0.4 mg Oral Daily   tiZANidine  4 mg Oral BH-qamhs   Continuous Infusions:  cefTRIAXone (ROCEPHIN)  IV Stopped (03/30/22 0547)   lactated ringers 75 mL/hr at 03/30/22 1300     LOS: 0 days    Time spent: 79mns    JKathie Dike MD [facility]   If 7PM-7AM, please contact  night-coverage [email or web address]  03/30/2022, 8:49 PM

## 2022-03-31 DIAGNOSIS — I10 Essential (primary) hypertension: Secondary | ICD-10-CM | POA: Diagnosis not present

## 2022-03-31 DIAGNOSIS — G114 Hereditary spastic paraplegia: Secondary | ICD-10-CM

## 2022-03-31 DIAGNOSIS — J121 Respiratory syncytial virus pneumonia: Secondary | ICD-10-CM | POA: Diagnosis not present

## 2022-03-31 DIAGNOSIS — N390 Urinary tract infection, site not specified: Secondary | ICD-10-CM | POA: Diagnosis not present

## 2022-03-31 LAB — URINE CULTURE: Culture: >=100000 — AB

## 2022-03-31 MED ORDER — GUAIFENESIN ER 600 MG PO TB12: 600.0000 mg | ORAL_TABLET | Freq: Two times a day (BID) | ORAL | 2 refills | Status: DC

## 2022-03-31 MED ORDER — ALBUTEROL SULFATE HFA 108 (90 BASE) MCG/ACT IN AERS: 2.0000 | INHALATION_SPRAY | Freq: Four times a day (QID) | RESPIRATORY_TRACT | 2 refills | Status: AC | PRN

## 2022-03-31 MED ORDER — PREDNISONE 20 MG PO TABS
40.0000 mg | ORAL_TABLET | Freq: Every day | ORAL | Status: DC
  Administered 2022-03-31: 40 mg via ORAL
  Filled 2022-03-31: qty 2

## 2022-03-31 MED ORDER — CEPHALEXIN 500 MG PO CAPS: 500.0000 mg | ORAL_CAPSULE | Freq: Three times a day (TID) | ORAL | 0 refills | Status: AC

## 2022-03-31 MED ORDER — PREDNISONE 10 MG PO TABS: ORAL_TABLET | ORAL | 0 refills | Status: DC

## 2022-03-31 NOTE — TOC Transition Note (Signed)
Transition of Care [facility]) - CM/SW Discharge Note   Patient Details  Name: Barry Powers MRN: [id number] Date of Birth: [birth year]  Transition of Care [facility]) CM/SW Contact:  Shade Flood, LCSW Phone Number: 03/31/2022, 10:40 AM   Clinical Narrative:     Per MD, pt will dc home today. [facility] will follow at dc. Information added to AVS.  There are no other TOC needs for dc.  Final next level of care: Home w Home Health Services Barriers to Discharge: Barriers Resolved   Patient Goals and CMS Choice CMS Medicare.gov Compare Post Acute Care list provided to:: Patient Choice offered to / list presented to : Patient  Discharge Placement                         Discharge Plan and Services Additional resources added to the After Visit Summary for   In-house Referral: Clinical Social Work Discharge Planning Services: CM Consult Post Acute Care Choice: [place]: PT [facility] Agency: [place] Date [place]: 03/31/22   Representative spoke with at [place]: [place] Determinants of Health ([place]) Interventions [place]: No Food Insecurity (03/29/2022)  Housing: Low Risk  (03/29/2022)  Transportation Needs: No Transportation Needs (03/29/2022)  Utilities: Not At Risk (03/29/2022)  Depression (PHQ2-9): Medium Risk (02/28/2022)  Tobacco Use: Low Risk  (03/29/2022)     Readmission Risk Interventions     No data to display

## 2022-03-31 NOTE — Discharge Summary (Signed)
Physician Discharge Summary  DARYAN BUELL ZHG:[id number] DOB: 10-03-1945 DOA: 03/28/2022  PCP: Alvira Monday, FNP  Admit date: 03/28/2022 Discharge date: 03/31/2022  Admitted From: Home Disposition: Home  Recommendations for Outpatient Follow-up:  Follow up with PCP in 1-2 weeks Please obtain BMP/CBC in one week   Home Health: Home health PT Equipment/Devices:  Discharge Condition: Stable CODE STATUS: Full code Diet recommendation: Heart healthy  Brief/Interim Summary: 76 year old male with a history of retrospective paraplegia, admitted to the hospital with dehydration, acute kidney injury and pneumonia due to RSV. Treated with steroids and bronchodilators for initial wheezing and shortness of breath. Also felt to have possible UTI and started on antibiotics   Discharge Diagnoses:  Principal Problem:   Pneumonia due to respiratory syncytial virus (RSV) Active Problems:   UTI (urinary tract infection)   Hypertension   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HCC)   Coronary artery 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   Viral pneumonia   RSV pneumonia -Treat supportively with bronchodilators, pulmonary hygiene -He is able to wean off of oxygen -Continue bronchodilators on discharge -Continue course of prednisone   Dehydration Acute kidney injury -Overall renal function has improved with IV fluids   Urinary tract infection -UA indicative of possible infection -Currently on ceftriaxone -Urine culture growing Aerococcus -Antibiotics transitioned to Keflex   Constipation -Resolved   Generalized weakness Hereditary spastic paraplegia -Likely exacerbated by underlying infectious process/dehydration -Seen by PT with recommendations for home health  Discharge Instructions  Discharge Instructions     Diet - low sodium heart healthy   Complete by: As directed    Increase activity slowly   Complete by: As directed        Allergies as of 03/31/2022       Reactions   Baclofen Other (See Comments)   Bad dreams    Penicillins Other (See Comments)   "Blacked-out"        Medication List     STOP taking these medications    oxybutynin 5 MG tablet Commonly known as: DITROPAN   pravastatin 40 MG tablet Commonly known as: PRAVACHOL       TAKE these medications    albuterol 108 (90 Base) MCG/ACT inhaler Commonly known as: VENTOLIN HFA Inhale 2 puffs into the lungs every 6 (six) hours as needed for wheezing or shortness of breath.   aspirin EC 81 MG tablet Take 81 mg by mouth daily.   cephALEXin 500 MG capsule Commonly known as: KEFLEX Take 1 capsule (500 mg total) by mouth 3 (three) times daily for 5 days.   Gemtesa 75 MG Tabs Generic drug: Vibegron Take 1 tablet by mouth daily.   guaiFENesin 600 MG 12 hr tablet Commonly known as: Mucinex Take 1 tablet (600 mg total) by mouth 2 (two) times daily.   metoprolol succinate 25 MG 24 hr tablet Commonly known as: TOPROL-XL Take 1 tablet (25 mg total) by mouth daily.   nitroGLYCERIN 0.4 MG SL tablet Commonly known as: NITROSTAT Place 1 tablet (0.4 mg total) under the tongue every 5 (five) minutes as needed for chest pain.   predniSONE 10 MG tablet Commonly known as: DELTASONE Take '40mg'$  po daily for 2 days then '30mg'$  daily for 2 days then '20mg'$  daily for 2 days then '10mg'$  daily for 2 days then stop   rosuvastatin 40 MG tablet Commonly known as: CRESTOR Take 1 tablet (40 mg total) by mouth daily.   tamsulosin 0.4 MG Caps capsule Commonly known as: FLOMAX  Take 1 capsule (0.4 mg total) by mouth daily.   tiZANidine 4 MG capsule Commonly known as: Zanaflex Take 1 capsule (4 mg total) by mouth in the morning and at bedtime.        Follow-up Information     [facility] Follow up.   Specialty: Home Health Services Why: [facility] staff will call you to schedule in home PT Contact information: [address] [place] [address] [phone]                Allergies  Allergen Reactions   Baclofen Other (See Comments)    Bad dreams    Penicillins Other (See Comments)    "Blacked-out"    Consultations:    Procedures/Studies: CT Angio Chest PE W and/or Wo Contrast  Result Date: 03/28/2022 CLINICAL DATA:  Spastic paralysis, evaluate for PE EXAM: CT ANGIOGRAPHY CHEST WITH CONTRAST TECHNIQUE: Multidetector CT imaging of the chest was performed using the standard protocol during bolus administration of intravenous contrast. Multiplanar CT image reconstructions and MIPs were obtained to evaluate the vascular anatomy. RADIATION DOSE REDUCTION: This exam was performed according to the departmental dose-optimization program which includes automated exposure control, adjustment of the mA and/or kV according to patient size and/or use of iterative reconstruction technique. CONTRAST:  43m OMNIPAQUE IOHEXOL 350 MG/ML SOLN COMPARISON:  Chest radiograph dated 03/28/2022 FINDINGS: Cardiovascular: Satisfactory opacification of the bilateral pulmonary arteries to the segmental level. No evidence of pulmonary embolism. No evidence of thoracic aortic aneurysm or dissection. Mild atherosclerotic calcifications of the aortic arch. Mild coronary is of the LAD. Mediastinum/Nodes: No suspicious mediastinal lymphadenopathy. Visualized thyroid is unremarkable. Lungs/Pleura: Evaluation lung parenchyma is constrained by respiratory motion. Within that constraint, there are no suspicious pulmonary nodules. Mild centrilobular emphysematous changes, upper lobe predominant. Faint tree-in-bud nodularity in the lingula and left lower lobe (series 6/image 60), suggesting mild infection/pneumonia. Trace bilateral pleural effusions.  No pneumothorax. Upper Abdomen: Visualized upper abdomen is notable for cholelithiasis, without associated inflammatory changes. Musculoskeletal: Visualized osseous structures are within  normal limits. Review of the MIP images confirms the above findings. IMPRESSION: No evidence of pulmonary embolism. Mild infection/pneumonia in the lingula and left lower lobe. Trace bilateral pleural effusions. Aortic Atherosclerosis (ICD10-I70.0) and Emphysema (ICD10-J43.9). Electronically Signed   By: SJulian HyM.D.   On: 03/28/2022 23:54   DG Hip Unilat W or Wo Pelvis 2-3 Views Left  Result Date: 03/28/2022 CLINICAL DATA:  Cough.  Falls, legs giving out. EXAM: DG HIP (WITH OR WITHOUT PELVIS) 2-3V LEFT COMPARISON:  None Available. FINDINGS: There is no evidence of hip fracture or dislocation. Degenerative changes are noted in the lower lumbar spine. Vascular calcifications are noted in the pelvis. IMPRESSION: No acute fracture or dislocation. Electronically Signed   By: LBrett FairyM.D.   On: 03/28/2022 20:21   DG Chest Port 1 View  Result Date: 03/28/2022 CLINICAL DATA:  Cough EXAM: PORTABLE CHEST 1 VIEW COMPARISON:  None Available. FINDINGS: The heart size and mediastinal contours are within normal limits. Is no lung consolidation, pleural effusion or pneumothorax. There is linear scarring or atelectasis in the left lung base. The visualized skeletal structures are unremarkable. IMPRESSION: No active disease. Electronically Signed   By: ARonney AstersM.D.   On: 03/28/2022 20:19      Subjective: Feeling better.  No shortness of breath.  He had a bowel movement yesterday.  Does not report any difficulty with his urine today.  Discharge Exam: Vitals:  03/30/22 1304 03/30/22 2018 03/31/22 0436 03/31/22 1351  BP: (!) 93/53 (!) 151/82 (!) 146/70 132/73  Pulse: (!) 51 (!) 58 62 (!) 56  Resp:  '18 16 15  '$ Temp: 98.3 F (36.8 C) (!) 97.3 F (36.3 C) (!) 97.4 F (36.3 C) (!) 97.4 F (36.3 C)  TempSrc:  Oral  Oral  SpO2: 97% 98% 96% 96%  Weight:      Height:        General: Pt is alert, awake, not in acute distress Cardiovascular: RRR, S1/S2 +, no rubs, no gallops Respiratory:  CTA bilaterally, no wheezing, no rhonchi Abdominal: Soft, NT, ND, bowel sounds + Extremities: no edema, no cyanosis    The results of significant diagnostics from this hospitalization (including imaging, microbiology, ancillary and laboratory) are listed below for reference.     Microbiology: Recent Results (from the past 240 hour(s))  Resp panel by RT-PCR (RSV, Flu A&B, Covid) Anterior Nasal Swab     Status: Abnormal   Collection Time: 03/28/22  8:12 PM   Specimen: Anterior Nasal Swab  Result Value Ref Range Status   SARS Coronavirus 2 by RT PCR NEGATIVE NEGATIVE Final    Comment: (NOTE) SARS-CoV-2 target nucleic acids are NOT DETECTED.  The SARS-CoV-2 RNA is generally detectable in upper respiratory specimens during the acute phase of infection. The lowest concentration of SARS-CoV-2 viral copies this assay can detect is 138 copies/mL. A negative result does not preclude SARS-Cov-2 infection and should not be used as the sole basis for treatment or other patient management decisions. A negative result may occur with  improper specimen collection/handling, submission of specimen other than nasopharyngeal swab, presence of viral mutation(s) within the areas targeted by this assay, and inadequate number of viral copies(<138 copies/mL). A negative result must be combined with clinical observations, patient history, and epidemiological information. The expected result is Negative.  Fact Sheet for Patients:  EntrepreneurPulse.com.au  Fact Sheet for Healthcare Providers:  IncredibleEmployment.be  This test is no t yet approved or cleared by the [place] FDA and  has been authorized for detection and/or diagnosis of SARS-CoV-2 by FDA under an Emergency Use Authorization (EUA). This EUA will remain  in effect (meaning this test can be used) for the duration of the COVID-19 declaration under Section 564(b)(1) of the Act, 21 U.S.C.section  360bbb-3(b)(1), unless the authorization is terminated  or revoked sooner.       Influenza A by PCR NEGATIVE NEGATIVE Final   Influenza B by PCR NEGATIVE NEGATIVE Final    Comment: (NOTE) The Xpert Xpress SARS-CoV-2/FLU/RSV plus assay is intended as an aid in the diagnosis of influenza from Nasopharyngeal swab specimens and should not be used as a sole basis for treatment. Nasal washings and aspirates are unacceptable for Xpert Xpress SARS-CoV-2/FLU/RSV testing.  Fact Sheet for Patients: EntrepreneurPulse.com.au  Fact Sheet for Healthcare Providers: IncredibleEmployment.be  This test is not yet approved or cleared by the [place] FDA and has been authorized for detection and/or diagnosis of SARS-CoV-2 by FDA under an Emergency Use Authorization (EUA). This EUA will remain in effect (meaning this test can be used) for the duration of the COVID-19 declaration under Section 564(b)(1) of the Act, 21 U.S.C. section 360bbb-3(b)(1), unless the authorization is terminated or revoked.     Resp Syncytial Virus by PCR POSITIVE (A) NEGATIVE Final    Comment: (NOTE) Fact Sheet for Patients: EntrepreneurPulse.com.au  Fact Sheet for Healthcare Providers: IncredibleEmployment.be  This test is not yet approved or cleared by the  [place] Architectural technologist and has been authorized for detection and/or diagnosis of SARS-CoV-2 by FDA under an Print production planner (EUA). This EUA will remain in effect (meaning this test can be used) for the duration of the COVID-19 declaration under Section 564(b)(1) of the Act, 21 U.S.C. section 360bbb-3(b)(1), unless the authorization is terminated or revoked.  Performed at [facility], [address], [place], [place] [address]   Urine Culture     Status: Abnormal   Collection Time: 03/29/22 12:25 AM   Specimen: Urine, Clean Catch  Result Value Ref Range Status   Specimen  Description   Final    URINE, CLEAN CATCH Performed at [facility], [address] [place], [place] [address]    Special Requests   Final    NONE Performed at [facility], [address] [place], [place] [address]    Culture (A)  Final    >=100,000 COLONIES/mL AEROCOCCUS SPECIES Standardized susceptibility testing for this organism is not available. Performed at [facility], [place] [address], [place], [place] [address]    Report Status 03/31/2022 FINAL  Final  Blood culture (routine x 2)     Status: None (Preliminary result)   Collection Time: 03/29/22 12:38 AM   Specimen: BLOOD RIGHT ARM  Result Value Ref Range Status   Specimen Description BLOOD RIGHT ARM  Final   Special Requests   Final    BOTTLES DRAWN AEROBIC AND ANAEROBIC Blood Culture adequate volume   Culture   Final    NO GROWTH 2 DAYS Performed at [facility], [address] [place], [place] [address]    Report Status PENDING  Incomplete  Blood culture (routine x 2)     Status: None (Preliminary result)   Collection Time: 03/29/22 12:39 AM   Specimen: BLOOD  Result Value Ref Range Status   Specimen Description BLOOD BLOOD RIGHT HAND  Final   Special Requests   Final    BOTTLES DRAWN AEROBIC AND ANAEROBIC Blood Culture adequate volume   Culture   Final    NO GROWTH 2 DAYS Performed at [facility], [address] [place], [place] [address]    Report Status PENDING  Incomplete     Labs: BNP (last 3 results) Recent Labs    03/28/22 2015  BNP 947.6*   Basic Metabolic Panel: Recent Labs  Lab 03/28/22 2015 03/30/22 0334  NA 137 138  K 3.2* 5.1  CL 105 108  CO2 24 23  GLUCOSE 127* 111*  BUN 14 17  CREATININE 1.56* 1.24  CALCIUM 8.3* 8.6*  MG 2.1 2.2   Liver Function Tests: Recent Labs  Lab 03/28/22 2015  AST 18  ALT 12  ALKPHOS 42  BILITOT 0.9  PROT 6.6  ALBUMIN 3.5   No results for input(s): "LIPASE", "AMYLASE" in the last 168 hours. No results for input(s): "AMMONIA" in the last  168 hours. CBC: Recent Labs  Lab 03/28/22 2015 03/30/22 0334  WBC 7.3 14.5*  NEUTROABS 3.7  --   HGB 14.2 13.6  HCT 43.0 41.1  MCV 87.2 86.9  PLT 146* 140*   Cardiac Enzymes: Recent Labs  Lab 03/28/22 2225  CKTOTAL 153   BNP: Invalid input(s): "POCBNP" CBG: No results for input(s): "GLUCAP" in the last 168 hours. D-Dimer No results for input(s): "DDIMER" in the last 72 hours. Hgb A1c No results for input(s): "HGBA1C" in the last 72 hours. Lipid Profile No results for input(s): "CHOL", "HDL", "LDLCALC", "TRIG", "CHOLHDL", "LDLDIRECT" in the last  72 hours. Thyroid function studies No results for input(s): "TSH", "T4TOTAL", "T3FREE", "THYROIDAB" in the last 72 hours.  Invalid input(s): "FREET3" Anemia work up No results for input(s): "VITAMINB12", "FOLATE", "FERRITIN", "TIBC", "IRON", "RETICCTPCT" in the last 72 hours. Urinalysis    Component Value Date/Time   COLORURINE AMBER (A) 03/29/2022 0025   APPEARANCEUR CLOUDY (A) 03/29/2022 0025   APPEARANCEUR Cloudy (A) 03/14/2022 0943   LABSPEC 1.023 03/29/2022 0025   PHURINE 5.0 03/29/2022 0025   GLUCOSEU NEGATIVE 03/29/2022 0025   HGBUR MODERATE (A) 03/29/2022 0025   BILIRUBINUR NEGATIVE 03/29/2022 0025   BILIRUBINUR Negative 03/14/2022 0943   KETONESUR 5 (A) 03/29/2022 0025   PROTEINUR 100 (A) 03/29/2022 0025   NITRITE NEGATIVE 03/29/2022 0025   LEUKOCYTESUR MODERATE (A) 03/29/2022 0025   Sepsis Labs Recent Labs  Lab 03/28/22 2015 03/30/22 0334  WBC 7.3 14.5*   Microbiology Recent Results (from the past 240 hour(s))  Resp panel by RT-PCR (RSV, Flu A&B, Covid) Anterior Nasal Swab     Status: Abnormal   Collection Time: 03/28/22  8:12 PM   Specimen: Anterior Nasal Swab  Result Value Ref Range Status   SARS Coronavirus 2 by RT PCR NEGATIVE NEGATIVE Final    Comment: (NOTE) SARS-CoV-2 target nucleic acids are NOT DETECTED.  The SARS-CoV-2 RNA is generally detectable in upper respiratory specimens during the  acute phase of infection. The lowest concentration of SARS-CoV-2 viral copies this assay can detect is 138 copies/mL. A negative result does not preclude SARS-Cov-2 infection and should not be used as the sole basis for treatment or other patient management decisions. A negative result may occur with  improper specimen collection/handling, submission of specimen other than nasopharyngeal swab, presence of viral mutation(s) within the areas targeted by this assay, and inadequate number of viral copies(<138 copies/mL). A negative result must be combined with clinical observations, patient history, and epidemiological information. The expected result is Negative.  Fact Sheet for Patients:  EntrepreneurPulse.com.au  Fact Sheet for Healthcare Providers:  IncredibleEmployment.be  This test is no t yet approved or cleared by the [place] FDA and  has been authorized for detection and/or diagnosis of SARS-CoV-2 by FDA under an Emergency Use Authorization (EUA). This EUA will remain  in effect (meaning this test can be used) for the duration of the COVID-19 declaration under Section 564(b)(1) of the Act, 21 U.S.C.section 360bbb-3(b)(1), unless the authorization is terminated  or revoked sooner.       Influenza A by PCR NEGATIVE NEGATIVE Final   Influenza B by PCR NEGATIVE NEGATIVE Final    Comment: (NOTE) The Xpert Xpress SARS-CoV-2/FLU/RSV plus assay is intended as an aid in the diagnosis of influenza from Nasopharyngeal swab specimens and should not be used as a sole basis for treatment. Nasal washings and aspirates are unacceptable for Xpert Xpress SARS-CoV-2/FLU/RSV testing.  Fact Sheet for Patients: EntrepreneurPulse.com.au  Fact Sheet for Healthcare Providers: IncredibleEmployment.be  This test is not yet approved or cleared by the [place] FDA and has been authorized for detection and/or  diagnosis of SARS-CoV-2 by FDA under an Emergency Use Authorization (EUA). This EUA will remain in effect (meaning this test can be used) for the duration of the COVID-19 declaration under Section 564(b)(1) of the Act, 21 U.S.C. section 360bbb-3(b)(1), unless the authorization is terminated or revoked.     Resp Syncytial Virus by PCR POSITIVE (A) NEGATIVE Final    Comment: (NOTE) Fact Sheet for Patients: EntrepreneurPulse.com.au  Fact Sheet for Healthcare Providers: IncredibleEmployment.be  This test  is not yet approved or cleared by the [place] and has been authorized for detection and/or diagnosis of SARS-CoV-2 by FDA under an Emergency Use Authorization (EUA). This EUA will remain in effect (meaning this test can be used) for the duration of the COVID-19 declaration under Section 564(b)(1) of the Act, 21 U.S.C. section 360bbb-3(b)(1), unless the authorization is terminated or revoked.  Performed at [facility], [address], [place], [place] [address]   Urine Culture     Status: Abnormal   Collection Time: 03/29/22 12:25 AM   Specimen: Urine, Clean Catch  Result Value Ref Range Status   Specimen Description   Final    URINE, CLEAN CATCH Performed at [facility], [address], [place], [place] [address]    Special Requests   Final    NONE Performed at [facility], [address] [place], [place] [address]    Culture (A)  Final    >=100,000 COLONIES/mL AEROCOCCUS SPECIES Standardized susceptibility testing for this organism is not available. Performed at [facility], [place] [address] [place], [place] [address]    Report Status 03/31/2022 FINAL  Final  Blood culture (routine x 2)     Status: None (Preliminary result)   Collection Time: 03/29/22 12:38 AM   Specimen: BLOOD RIGHT ARM  Result Value Ref Range Status   Specimen Description BLOOD RIGHT ARM  Final   Special Requests   Final    BOTTLES DRAWN AEROBIC AND  ANAEROBIC Blood Culture adequate volume   Culture   Final    NO GROWTH 2 DAYS Performed at [facility], [address] [place], [place] [address]    Report Status PENDING  Incomplete  Blood culture (routine x 2)     Status: None (Preliminary result)   Collection Time: 03/29/22 12:39 AM   Specimen: BLOOD  Result Value Ref Range Status   Specimen Description BLOOD BLOOD RIGHT HAND  Final   Special Requests   Final    BOTTLES DRAWN AEROBIC AND ANAEROBIC Blood Culture adequate volume   Culture   Final    NO GROWTH 2 DAYS Performed at [facility], [address], [place], [place] [address]    Report Status PENDING  Incomplete     Time coordinating discharge: 31mns  SIGNED:   JKathie Dike MD  [facility] 03/31/2022, 8:07 PM   If 7PM-7AM, please contact night-coverage [email or web address]

## 2022-03-31 NOTE — Progress Notes (Signed)
[facility] Discharge Note  Admit Date:  03/28/2022 Discharge date: 03/31/2022   Barry Powers to be D/C'd Home per MD order.  AVS completed. Patient/caregiver able to verbalize understanding.  Discharge Medication: Allergies as of 03/31/2022       Reactions   Baclofen Other (See Comments)   Bad dreams    Penicillins Other (See Comments)   "Blacked-out"        Medication List     STOP taking these medications    oxybutynin 5 MG tablet Commonly known as: DITROPAN   pravastatin 40 MG tablet Commonly known as: PRAVACHOL       TAKE these medications    albuterol 108 (90 Base) MCG/ACT inhaler Commonly known as: VENTOLIN HFA Inhale 2 puffs into the lungs every 6 (six) hours as needed for wheezing or shortness of breath.   aspirin EC 81 MG tablet Take 81 mg by mouth daily.   cephALEXin 500 MG capsule Commonly known as: KEFLEX Take 1 capsule (500 mg total) by mouth 3 (three) times daily for 5 days.   Gemtesa 75 MG Tabs Generic drug: Vibegron Take 1 tablet by mouth daily.   guaiFENesin 600 MG 12 hr tablet Commonly known as: Mucinex Take 1 tablet (600 mg total) by mouth 2 (two) times daily.   metoprolol succinate 25 MG 24 hr tablet Commonly known as: TOPROL-XL Take 1 tablet (25 mg total) by mouth daily.   nitroGLYCERIN 0.4 MG SL tablet Commonly known as: NITROSTAT Place 1 tablet (0.4 mg total) under the tongue every 5 (five) minutes as needed for chest pain.   predniSONE 10 MG tablet Commonly known as: DELTASONE Take '40mg'$  po daily for 2 days then '30mg'$  daily for 2 days then '20mg'$  daily for 2 days then '10mg'$  daily for 2 days then stop   rosuvastatin 40 MG tablet Commonly known as: CRESTOR Take 1 tablet (40 mg total) by mouth daily.   tamsulosin 0.4 MG Caps capsule Commonly known as: FLOMAX Take 1 capsule (0.4 mg total) by mouth daily.   tiZANidine 4 MG capsule Commonly known as: Zanaflex Take 1 capsule (4 mg total) by mouth in the morning and at bedtime.         Discharge Assessment: Vitals:   03/31/22 0436 03/31/22 1351  BP: (!) 146/70 132/73  Pulse: 62 (!) 56  Resp: 16 15  Temp: (!) 97.4 F (36.3 C) (!) 97.4 F (36.3 C)  SpO2: 96% 96%   Skin clean, dry and intact without evidence of skin break down, no evidence of skin tears noted. IV catheter discontinued intact. Site without signs and symptoms of complications - no redness or edema noted at insertion site, patient denies c/o pain - only slight tenderness at site.  Dressing with slight pressure applied.  D/c Instructions-Education: Discharge instructions given to patient/family with verbalized understanding. D/c education completed with patient/family including follow up instructions, medication list, d/c activities limitations if indicated, with other d/c instructions as indicated by MD - patient able to verbalize understanding, all questions fully answered. Patient instructed to return to ED, call 911, or call MD for any changes in condition.  Patient escorted via [place], and D/C home via private auto.  Richrd Prime, RN 03/31/2022 2:59 PM

## 2022-03-31 NOTE — Progress Notes (Signed)
Patient slept through the night no complaints of pain this shift. Continued to monitor.

## 2022-04-03 LAB — CULTURE, BLOOD (ROUTINE X 2)
Culture: NO GROWTH
Culture: NO GROWTH
Special Requests: ADEQUATE
Special Requests: ADEQUATE

## 2022-04-04 ENCOUNTER — Telehealth: Payer: Self-pay | Admitting: Family Medicine

## 2022-04-04 NOTE — Telephone Encounter (Signed)
Transition Care Management Follow-up Telephone Call Date of discharge and from where: 03/31/22 - [facility] How have you been since you were released from the hospital? Feeling better Any questions or concerns? Yes-Went to pharmacy to get RX and is unable to get Guaifensesin 686m. But states he is feeling better and doesn't feel he needs it. Patient encouraged to take meds as advised by ED.  Items Reviewed: Did the pt receive and understand the discharge instructions provided? Yes  Medications obtained and verified?  Yes, all but Mucinex Other? No  Any new allergies since your discharge? No  Dietary orders reviewed? Yes Do you have support at home? Yes   Home Care and Equipment/Supplies: Were home health services ordered? yes If so, what is the name of the agency? [facility] Has the agency set up a time to come to the patient's home? States daughter is handling this and she is not there to answer these questions.  Were any new equipment or medical supplies ordered?  No What is the name of the medical supply agency?  Were you able to get the supplies/equipment? not applicable Do you have any questions related to the use of the equipment or supplies? No  Functional Questionnaire: (I = Independent and D = Dependent) ADLs:   Bathing/Dressing- Yes  Meal Prep- No-Spouse helps with that  Eating- Yes  Maintaining continence- Yes  Transferring/Ambulation- Yes  Managing Meds- Yes  Follow up appointments reviewed:  PCP Hospital f/u appt confirmed? Yes  Scheduled to see PCP on [facility]12 @ 330PM. [facility]  Scheduled to see  on  @ . Are transportation arrangements needed? No  If their condition worsens, is the pt aware to call PCP or go to the Emergency Dept.? Yes Was the patient provided with contact information for the PCP's office or ED? Yes Was to pt encouraged to call back with questions or concerns? Yes

## 2022-04-11 ENCOUNTER — Other Ambulatory Visit: Payer: Self-pay | Admitting: Cardiovascular Disease

## 2022-04-11 ENCOUNTER — Telehealth: Payer: Self-pay | Admitting: Internal Medicine

## 2022-04-11 MED ORDER — METOPROLOL SUCCINATE ER 25 MG PO TB24: 25.0000 mg | ORAL_TABLET | Freq: Every day | ORAL | 1 refills | Status: DC

## 2022-04-11 NOTE — Telephone Encounter (Signed)
*  STAT* If patient is at the pharmacy, call can be transferred to refill team.   1. Which medications need to be refilled? (please list name of each medication and dose if known)  metoprolol succinate (TOPROL-XL) 25 MG 24 hr tablet  2. Which pharmacy/location (including street and city if local pharmacy) is medication to be sent to? [place], [place] [address]    3. Do they need a 30 day or 90 day supply?  90 day supply

## 2022-04-11 NOTE — Telephone Encounter (Signed)
Completed.

## 2022-04-14 DIAGNOSIS — J189 Pneumonia, unspecified organism: Secondary | ICD-10-CM | POA: Diagnosis not present

## 2022-04-21 ENCOUNTER — Encounter: Payer: Self-pay | Admitting: Family Medicine

## 2022-04-21 ENCOUNTER — Other Ambulatory Visit: Payer: Self-pay

## 2022-04-21 ENCOUNTER — Ambulatory Visit (INDEPENDENT_AMBULATORY_CARE_PROVIDER_SITE_OTHER): Payer: Medicare Other | Admitting: Family Medicine

## 2022-04-21 VITALS — BP 134/73 | HR 73 | Ht 69.0 in | Wt 168.0 lb

## 2022-04-21 DIAGNOSIS — J121 Respiratory syncytial virus pneumonia: Secondary | ICD-10-CM | POA: Diagnosis not present

## 2022-04-21 NOTE — Patient Instructions (Addendum)
I appreciate the opportunity to provide care to you today!    Follow up:  06/01/22  Labs: please stop by the lab today to get your blood drawn (CBC, BMP)  Please stop by [facility] hospital on 06/02/22 to get an x-ray of your chest to assess pneumonia clearing    Please continue to a heart-healthy diet and increase your physical activities. Try to exercise for 62mns at least five times a week.      It was a pleasure to see you and I look forward to continuing to work together on your health and well-being. Please do not hesitate to call the office if you need care or have questions about your care.   Have a wonderful day and week. With Gratitude, GAlvira MondayMSN, FNP-BC

## 2022-04-21 NOTE — Progress Notes (Signed)
Established Patient Office Visit  Subjective:  Patient ID: Barry Powers, male    DOB: December 02, 1945  Age: 77 y.o. MRN: [id number]  CC:  Chief Complaint  Patient presents with   Follow-up    Ed f/u from 03/28/22 to 03/31/2022, due to pneumonia, pt reports feeling well today.     HPI Barry Powers is a 77 y.o. male with past medical history of retrospective paraplegia  presents for ED follow-up.  Pneumonia due to RSV: The patient was seen and treated in the ED for pneumonia due to RSV.  He was treated supportively with bronchodilator,pulmonary hygiene, and discharged home with prednisone taper, which he has completed.  He was noted to have urinary tract infection and was discharged home with Keflex 500 mg 3 times daily for 5 days.  He reports feeling well today, and denies symptoms of fatigue, fever, shortness of breath, and chills.  Past Medical History:  Diagnosis Date   Coronary artery disease    Hypercholesteremia    Hypertension    NSTEMI (non-ST elevated myocardial infarction) [facility])     Past Surgical History:  Procedure Laterality Date   CERVICAL LAMINECTOMY     LUMBAR SPINE SURGERY      Family History  Problem Relation Age of Onset   Alzheimer's disease Mother    Aneurysm Father    COPD Sister     Social History   Socioeconomic History   Marital status: Married    Spouse name: Not on file   Number of children: Not on file   Years of education: Not on file   Highest education level: Not on file  Occupational History   Not on file  Tobacco Use   Smoking status: Never   Smokeless tobacco: Never  Vaping Use   Vaping Use: Never used  Substance and Sexual Activity   Alcohol use: No   Drug use: No   Sexual activity: Not Currently  Other Topics Concern   Not on file  Social History Narrative   Not on file   Social Determinants of Health   Financial Resource Strain: Not on file  Food Insecurity: No Food Insecurity (03/29/2022)   Hunger Vital Sign     Worried About Running Out of Food in the Last Year: Never true    Ran Out of Food in the Last Year: Never true  Transportation Needs: No Transportation Needs (03/29/2022)   PRAPARE - Hydrologist (Medical): No    Lack of Transportation (Non-Medical): No  Physical Activity: Not on file  Stress: Not on file  Social Connections: Not on file  Intimate Partner Violence: Not At Risk (03/29/2022)   Humiliation, Afraid, Rape, and Kick questionnaire    Fear of Current or Ex-Partner: No    Emotionally Abused: No    Physically Abused: No    Sexually Abused: No    Outpatient Medications Prior to Visit  Medication Sig Dispense Refill   albuterol (VENTOLIN HFA) 108 (90 Base) MCG/ACT inhaler Inhale 2 puffs into the lungs every 6 (six) hours as needed for wheezing or shortness of breath. 8 g 2   aspirin EC 81 MG tablet Take 81 mg by mouth daily.     metoprolol succinate (TOPROL-XL) 25 MG 24 hr tablet Take 1 tablet (25 mg total) by mouth daily. 90 tablet 1   rosuvastatin (CRESTOR) 40 MG tablet Take 1 tablet (40 mg total) by mouth daily. 90 tablet 3   tamsulosin (FLOMAX) 0.4 MG  CAPS capsule Take 1 capsule (0.4 mg total) by mouth daily. 30 capsule 3   Vibegron (GEMTESA) 75 MG TABS Take 1 tablet by mouth daily.     guaiFENesin (MUCINEX) 600 MG 12 hr tablet Take 1 tablet (600 mg total) by mouth 2 (two) times daily. 60 tablet 2   predniSONE (DELTASONE) 10 MG tablet Take '40mg'$  po daily for 2 days then '30mg'$  daily for 2 days then '20mg'$  daily for 2 days then '10mg'$  daily for 2 days then stop 20 tablet 0   tiZANidine (ZANAFLEX) 4 MG capsule Take 1 capsule (4 mg total) by mouth in the morning and at bedtime. 60 capsule 1   nitroGLYCERIN (NITROSTAT) 0.4 MG SL tablet Place 1 tablet (0.4 mg total) under the tongue every 5 (five) minutes as needed for chest pain. (Patient not taking: Reported on 04/21/2022) 25 tablet 3   No facility-administered medications prior to visit.    Allergies   Allergen Reactions   Baclofen Other (See Comments)    Bad dreams    Penicillins Other (See Comments)    "Blacked-out"    ROS Review of Systems  Constitutional:  Negative for chills, fatigue and fever.  HENT:  Negative for congestion.   Eyes:  Negative for visual disturbance.  Respiratory:  Negative for chest tightness and shortness of breath.   Cardiovascular:  Negative for chest pain and palpitations.  Neurological:  Negative for dizziness and headaches.      Objective:    Physical Exam HENT:     Head: Normocephalic.     Right Ear: External ear normal.     Left Ear: External ear normal.     Nose: No congestion or rhinorrhea.     Mouth/Throat:     Mouth: Mucous membranes are moist.  Cardiovascular:     Rate and Rhythm: Regular rhythm.     Heart sounds: No murmur heard. Pulmonary:     Effort: No respiratory distress.     Breath sounds: Normal breath sounds. No wheezing.  Neurological:     Mental Status: He is alert.     BP 134/73   Pulse 73   Ht '5\' 9"'$  (1.753 m)   Wt 168 lb (76.2 kg)   SpO2 94%   BMI 24.81 kg/m  Wt Readings from Last 3 Encounters:  04/21/22 168 lb (76.2 kg)  03/28/22 167 lb 15.9 oz (76.2 kg)  02/28/22 168 lb 0.8 oz (76.2 kg)    No results found for: "TSH" Lab Results  Component Value Date   WBC 14.5 (H) 03/30/2022   HGB 13.6 03/30/2022   HCT 41.1 03/30/2022   MCV 86.9 03/30/2022   PLT 140 (L) 03/30/2022   Lab Results  Component Value Date   NA 138 03/30/2022   K 5.1 03/30/2022   CO2 23 03/30/2022   GLUCOSE 111 (H) 03/30/2022   BUN 17 03/30/2022   CREATININE 1.24 03/30/2022   BILITOT 0.9 03/28/2022   ALKPHOS 42 03/28/2022   AST 18 03/28/2022   ALT 12 03/28/2022   PROT 6.6 03/28/2022   ALBUMIN 3.5 03/28/2022   CALCIUM 8.6 (L) 03/30/2022   ANIONGAP 7 03/30/2022   Lab Results  Component Value Date   CHOL 266 (H) 01/31/2022   Lab Results  Component Value Date   HDL 37 (L) 01/31/2022   Lab Results  Component Value Date    LDLCALC 176 (H) 01/31/2022   Lab Results  Component Value Date   TRIG 263 (H) 01/31/2022   Lab Results  Component Value Date   CHOLHDL 7.2 01/31/2022   No results found for: "HGBA1C"    Assessment & Plan:  Pneumonia due to respiratory syncytial virus (RSV) Assessment & Plan: Labs and imaging revealed Will assess CBC and BMP today Checks x-ray or other in 6 weeks for pneumonia clearing   Orders: -     CBC -     Basic metabolic panel -     DG Chest 2 View; Future    Follow-up: Return if symptoms worsen or fail to improve.   Alvira Monday, FNP

## 2022-04-21 NOTE — Assessment & Plan Note (Signed)
Labs and imaging revealed Will assess CBC and BMP today Checks x-ray or other in 6 weeks for pneumonia clearing

## 2022-04-22 LAB — CBC
Hematocrit: 46.8 % (ref 37.5–51.0)
Hemoglobin: 15.1 g/dL (ref 13.0–17.7)
MCH: 28.1 pg (ref 26.6–33.0)
MCHC: 32.3 g/dL (ref 31.5–35.7)
MCV: 87 fL (ref 79–97)
Platelets: 156 10*3/uL (ref 150–450)
RBC: 5.37 x10E6/uL (ref 4.14–5.80)
RDW: 15.3 % (ref 11.6–15.4)
WBC: 7.6 10*3/uL (ref 3.4–10.8)

## 2022-04-22 LAB — BASIC METABOLIC PANEL
BUN/Creatinine Ratio: 7 — ABNORMAL LOW (ref 10–24)
BUN: 10 mg/dL (ref 8–27)
CO2: 25 mmol/L (ref 20–29)
Calcium: 9.2 mg/dL (ref 8.6–10.2)
Chloride: 104 mmol/L (ref 96–106)
Creatinine, Ser: 1.47 mg/dL — ABNORMAL HIGH (ref 0.76–1.27)
Glucose: 106 mg/dL — ABNORMAL HIGH (ref 70–99)
Potassium: 4.5 mmol/L (ref 3.5–5.2)
Sodium: 143 mmol/L (ref 134–144)
eGFR: 49 mL/min/{1.73_m2} — ABNORMAL LOW (ref >59)

## 2022-04-25 ENCOUNTER — Encounter: Payer: Self-pay | Admitting: Urology

## 2022-04-25 ENCOUNTER — Ambulatory Visit (INDEPENDENT_AMBULATORY_CARE_PROVIDER_SITE_OTHER): Payer: Medicare Other | Admitting: Urology

## 2022-04-25 VITALS — BP 132/77 | HR 59

## 2022-04-25 DIAGNOSIS — N529 Male erectile dysfunction, unspecified: Secondary | ICD-10-CM | POA: Diagnosis not present

## 2022-04-25 DIAGNOSIS — N401 Enlarged prostate with lower urinary tract symptoms: Secondary | ICD-10-CM | POA: Diagnosis not present

## 2022-04-25 DIAGNOSIS — R350 Frequency of micturition: Secondary | ICD-10-CM

## 2022-04-25 LAB — URINALYSIS, ROUTINE W REFLEX MICROSCOPIC
Bilirubin, UA: NEGATIVE
Glucose, UA: NEGATIVE
Ketones, UA: NEGATIVE
Nitrite, UA: NEGATIVE
Specific Gravity, UA: 1.025 (ref 1.005–1.030)
Urobilinogen, Ur: 0.2 mg/dL (ref 0.2–1.0)
pH, UA: 5.5 (ref 5.0–7.5)

## 2022-04-25 LAB — MICROSCOPIC EXAMINATION

## 2022-04-25 MED ORDER — SOLIFENACIN SUCCINATE 10 MG PO TABS: 10.0000 mg | ORAL_TABLET | Freq: Every day | ORAL | 11 refills | Status: DC

## 2022-04-25 NOTE — Progress Notes (Signed)
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  Symptoms have been worsening over the past few years.  His neurologic diagnosis goes back about 20 years.  He was recently put on tamsulosin which has helped his urinary symptomatology.  He was also prescribed oxybutynin which he has not started yet.  This is his first visit to a urologist.   IPSS 13, quality-of-life score 5.  Residual urine volume 56 mL.  He was given samples of Gemtesa, told to continue on tamsulosin.  He was given over to bladder guide sheet.  1.16.2024: Here today for routine follow-up.  He tolerated the Gemtesa well.  Urinary symptoms improved significantly, he is quite pleased with this.  IPSS 0.  He feels like he is emptying well.    Past Medical History:  Diagnosis Date   Coronary artery disease    Hypercholesteremia    Hypertension    NSTEMI (non-ST elevated myocardial infarction) [facility])     Past Surgical History:  Procedure Laterality Date   CERVICAL LAMINECTOMY     LUMBAR SPINE SURGERY      Home Medications:  Allergies as of 04/25/2022       Reactions   Baclofen Other (See Comments)   Bad dreams    Penicillins Other (See Comments)   "Blacked-out"        Medication List        Accurate as of April 25, 2022 11:26 AM. If you have any questions, ask your nurse or doctor.          albuterol 108 (90 Base) MCG/ACT inhaler Commonly known as: VENTOLIN HFA Inhale 2 puffs into the lungs every 6 (six) hours as needed for wheezing or shortness of breath.   aspirin EC 81 MG tablet Take 81 mg by mouth daily.   Gemtesa 75 MG Tabs Generic drug: Vibegron Take 1 tablet by mouth daily.   metoprolol succinate 25 MG 24 hr tablet Commonly known as: TOPROL-XL Take 1 tablet (25 mg total) by mouth daily.   nitroGLYCERIN 0.4 MG SL  tablet Commonly known as: NITROSTAT Place 1 tablet (0.4 mg total) under the tongue every 5 (five) minutes as needed for chest pain.   rosuvastatin 40 MG tablet Commonly known as: CRESTOR Take 1 tablet (40 mg total) by mouth daily.   tamsulosin 0.4 MG Caps capsule Commonly known as: FLOMAX Take 1 capsule (0.4 mg total) by mouth daily.        Allergies:  Allergies  Allergen Reactions   Baclofen Other (See Comments)    Bad dreams    Penicillins Other (See Comments)    "Blacked-out"    Family History  Problem Relation Age of Onset   Alzheimer's disease Mother    Aneurysm Father    COPD Sister     Social History:  reports that he has never smoked. He has never used smokeless tobacco. He reports that he does not drink alcohol and does not use drugs.  ROS: A complete review of systems was performed.  All systems are negative except for pertinent findings as noted.  Physical Exam:  Vital signs in last 24 hours: BP 132/77   Pulse (!) 59  Constitutional:  Alert and oriented, No acute distress Cardiovascular: Regular rate  Respiratory: Normal respiratory effort Psychiatric: Normal mood and affect  I have reviewed prior pt notes  I have reviewed notes from referring/previous physicians-recent  hospital notes reviewed.  Admitted for RSV.  I have reviewed urinalysis result  I have reviewed prior urine culture-grew Aerococcus recently   Impression/Assessment:  BPH with LUTS-improved with tamsulosin and overactive bladder medication  Plan:  1.  I did let him know that multiple caffeinated beverages can affect bladder function  2.  Continue tamsulosin.  I added Solifenacin  3.  I will see back on an annual basis.

## 2022-04-26 ENCOUNTER — Other Ambulatory Visit: Payer: Self-pay | Admitting: Family Medicine

## 2022-04-26 DIAGNOSIS — R7301 Impaired fasting glucose: Secondary | ICD-10-CM

## 2022-04-26 DIAGNOSIS — E559 Vitamin D deficiency, unspecified: Secondary | ICD-10-CM

## 2022-04-26 DIAGNOSIS — E7849 Other hyperlipidemia: Secondary | ICD-10-CM

## 2022-04-26 DIAGNOSIS — E038 Other specified hypothyroidism: Secondary | ICD-10-CM

## 2022-04-26 NOTE — Progress Notes (Signed)
Please inform the patient to increase his fluid intake, at least 64 ounces daily.  His potassium levels are stable.

## 2022-05-08 ENCOUNTER — Telehealth: Payer: Self-pay

## 2022-05-08 DIAGNOSIS — N399 Disorder of urinary system, unspecified: Secondary | ICD-10-CM | POA: Diagnosis not present

## 2022-05-08 DIAGNOSIS — R319 Hematuria, unspecified: Secondary | ICD-10-CM | POA: Diagnosis not present

## 2022-05-08 DIAGNOSIS — Z6825 Body mass index (BMI) 25.0-25.9, adult: Secondary | ICD-10-CM | POA: Diagnosis not present

## 2022-05-08 DIAGNOSIS — E663 Overweight: Secondary | ICD-10-CM | POA: Diagnosis not present

## 2022-05-08 NOTE — Telephone Encounter (Signed)
Wife called stating that patient has been having trouble voiding since medication change. Patient  is currently at urgent care getting evaluated.  Please advise.

## 2022-05-09 NOTE — Telephone Encounter (Signed)
Patient  voiced he is taking Vesicare and he is not taking [place]. Made patient aware that I will send a task to Dr. Diona Fanti to make sure which medication he is supposed stop.  Patient voiced that he was given samples of Gemesta in office and it help him significant. Patient voiced that he would like to be back on Gemesta. Patient voiced understanding

## 2022-05-11 ENCOUNTER — Other Ambulatory Visit: Payer: Self-pay | Admitting: Urology

## 2022-05-12 NOTE — Telephone Encounter (Signed)
Pt has d/c Solifenacin. I provided pt with Gemtesa samples.  Patient states Barry Powers was more effective than the Solifenacin.  I see an rx for Barry Powers is pending.  Can you send a prescription to [facility] in [place].

## 2022-05-15 ENCOUNTER — Telehealth: Payer: Self-pay

## 2022-05-15 ENCOUNTER — Other Ambulatory Visit: Payer: Self-pay

## 2022-05-15 MED ORDER — GEMTESA 75 MG PO TABS: 1.0000 | ORAL_TABLET | Freq: Every day | ORAL | 11 refills | Status: DC

## 2022-05-15 NOTE — Telephone Encounter (Signed)
Patient wants to know if refills will be called in after taking GEMTESA samples.  Please advise.

## 2022-05-15 NOTE — Telephone Encounter (Signed)
Called pt and made him aware rx was sent to [facility].

## 2022-05-16 ENCOUNTER — Other Ambulatory Visit: Payer: Self-pay | Admitting: Urology

## 2022-05-16 DIAGNOSIS — R350 Frequency of micturition: Secondary | ICD-10-CM

## 2022-05-16 MED ORDER — GEMTESA 75 MG PO TABS: 1.0000 | ORAL_TABLET | Freq: Every day | ORAL | 11 refills | Status: DC

## 2022-05-16 NOTE — Telephone Encounter (Signed)
Patient aware.  He will call back if a PA or Tier exemption is needed.

## 2022-05-23 ENCOUNTER — Ambulatory Visit (INDEPENDENT_AMBULATORY_CARE_PROVIDER_SITE_OTHER): Payer: Medicare Other | Admitting: Urology

## 2022-05-23 ENCOUNTER — Encounter: Payer: Self-pay | Admitting: Urology

## 2022-05-23 VITALS — BP 166/82 | HR 61

## 2022-05-23 DIAGNOSIS — N401 Enlarged prostate with lower urinary tract symptoms: Secondary | ICD-10-CM | POA: Diagnosis not present

## 2022-05-23 DIAGNOSIS — N138 Other obstructive and reflux uropathy: Secondary | ICD-10-CM

## 2022-05-23 DIAGNOSIS — R339 Retention of urine, unspecified: Secondary | ICD-10-CM | POA: Diagnosis not present

## 2022-05-23 LAB — BLADDER SCAN AMB NON-IMAGING: Scan Result: <1430

## 2022-05-23 NOTE — Progress Notes (Signed)
Pt here today for bladder scan. Bladder was scanned and <1430 was visualized.    Performed by Marisue Brooklyn, CMA

## 2022-05-23 NOTE — Progress Notes (Signed)
Simple Catheter Placement  Due to urinary retention patient is present today for a foley cath placement.  Patient was cleaned and prepped in a sterile fashion with betadine and 2% lidocaine jelly was instilled into the urethra. A 16 FR foley catheter was inserted, urine return was noted  1710m, urine was yellow in color.  The balloon was filled with 10cc of sterile water.  A leg bag was attached for drainage. Patient was also given a night bag to take home and was given instruction on how to change from one bag to another.  Patient was given instruction on proper catheter care.  Patient tolerated well, no complications were noted   Performed by: KLevi Aland CMA  Additional notes/ Follow up: follow up as scheduled.

## 2022-05-23 NOTE — Progress Notes (Signed)
History of Present Illness: Mr. Wendland comes in today with his wife for follow-up of voiding issues.  He is apparently on Gemtesa, having switched back from Solifenacin.  He thinks he is taking tamsulosin.  He has had lower abdominal discomfort, uncontrolled leakage and feels a full area palpably in his suprapubic area.  Past Medical History:  Diagnosis Date   Coronary artery disease    Hypercholesteremia    Hypertension    NSTEMI (non-ST elevated myocardial infarction) [facility])     Past Surgical History:  Procedure Laterality Date   CERVICAL LAMINECTOMY     LUMBAR SPINE SURGERY      Home Medications:  Allergies as of 05/23/2022       Reactions   Baclofen Other (See Comments)   Bad dreams    Penicillins Other (See Comments)   "Blacked-out"        Medication List        Accurate as of May 23, 2022  8:51 AM. If you have any questions, ask your nurse or doctor.          albuterol 108 (90 Base) MCG/ACT inhaler Commonly known as: VENTOLIN HFA Inhale 2 puffs into the lungs every 6 (six) hours as needed for wheezing or shortness of breath.   aspirin EC 81 MG tablet Take 81 mg by mouth daily.   Gemtesa 75 MG Tabs Generic drug: Vibegron Take 1 tablet (75 mg total) by mouth daily.   Gemtesa 75 MG Tabs Generic drug: Vibegron Take 1 tablet (75 mg total) by mouth daily.   metoprolol succinate 25 MG 24 hr tablet Commonly known as: TOPROL-XL Take 1 tablet (25 mg total) by mouth daily.   nitroGLYCERIN 0.4 MG SL tablet Commonly known as: NITROSTAT Place 1 tablet (0.4 mg total) under the tongue every 5 (five) minutes as needed for chest pain.   rosuvastatin 40 MG tablet Commonly known as: CRESTOR Take 1 tablet (40 mg total) by mouth daily.   solifenacin 10 MG tablet Commonly known as: VESICARE Take 1 tablet (10 mg total) by mouth daily.   tamsulosin 0.4 MG Caps capsule Commonly known as: FLOMAX Take 1 capsule (0.4 mg total) by mouth daily.         Allergies:  Allergies  Allergen Reactions   Baclofen Other (See Comments)    Bad dreams    Penicillins Other (See Comments)    "Blacked-out"    Family History  Problem Relation Age of Onset   Alzheimer's disease Mother    Aneurysm Father    COPD Sister     Social History:  reports that he has never smoked. He has never used smokeless tobacco. He reports that he does not drink alcohol and does not use drugs.  ROS: A complete review of systems was performed.  All systems are negative except for pertinent findings as noted.  Physical Exam:  Vital signs in last 24 hours: There were no vitals taken for this visit. Constitutional:  Alert and oriented, No acute distress Cardiovascular: Regular rate  Respiratory: Normal respiratory effort Psychiatric: Normal mood and affect  I have reviewed prior pt notes  I have reviewed urinalysis results  I have independently reviewed prior imaging  Prior bladder scan a 56 mL and current bladder scan of 1400 mL reviewed   Impression/Assessment:  BPH with lower urinary tract symptoms, now with urinary retention having been put on OAB medications.  Question whether he is taking the tamsulosin or not  Urinary retention, significant  Plan:  1.  80 French Foley catheter placed today  2.  He was given instructions to continue the tamsulosin and make sure he is not on either Gemtesa or Solifenacin  3.  I will check a basic metabolic panel today  4.  Office visit 1 week for voiding trial

## 2022-05-24 LAB — BASIC METABOLIC PANEL
BUN/Creatinine Ratio: 6 — ABNORMAL LOW (ref 10–24)
BUN: 41 mg/dL — ABNORMAL HIGH (ref 8–27)
CO2: 18 mmol/L — ABNORMAL LOW (ref 20–29)
Calcium: 8.8 mg/dL (ref 8.6–10.2)
Chloride: 107 mmol/L — ABNORMAL HIGH (ref 96–106)
Creatinine, Ser: 7.17 mg/dL — ABNORMAL HIGH (ref 0.76–1.27)
Glucose: 95 mg/dL (ref 70–99)
Potassium: 4.4 mmol/L (ref 3.5–5.2)
Sodium: 145 mmol/L — ABNORMAL HIGH (ref 134–144)
eGFR: 7 mL/min/{1.73_m2} — ABNORMAL LOW (ref >59)

## 2022-05-26 ENCOUNTER — Telehealth: Payer: Self-pay

## 2022-05-26 ENCOUNTER — Other Ambulatory Visit: Payer: Self-pay | Admitting: Urology

## 2022-05-26 DIAGNOSIS — R339 Retention of urine, unspecified: Secondary | ICD-10-CM

## 2022-05-26 NOTE — Telephone Encounter (Signed)
-----   Message from Franchot Gallo, MD sent at 05/26/2022 10:25 AM EST ----- Please call pt/wife. Kidney function Tuesday was pretty bad b/c of obstruction. It shouls be better now w/ catheter drainage, but I would recommend checking another one today to make sure it is better. I put another order in ----- Message ----- From: Sherrilyn Rist, CMA Sent: 05/24/2022  11:56 AM EST To: Franchot Gallo, MD  Please review

## 2022-05-26 NOTE — Telephone Encounter (Signed)
Made patient/wife aware that his kidney function on Tuesday was pretty bad due to an obstruction but it should be better now with catheter drainage.  BMP should be done to make sure kidney function is better. Wife/Patient states that he can do the BMP on Tuesday when patient return to office for catheter removal. Wife/Patient voiced understanding.

## 2022-05-26 NOTE — Progress Notes (Signed)
Open in error

## 2022-05-30 ENCOUNTER — Ambulatory Visit (INDEPENDENT_AMBULATORY_CARE_PROVIDER_SITE_OTHER): Payer: Medicare Other | Admitting: Urology

## 2022-05-30 VITALS — BP 132/69 | HR 58

## 2022-05-30 DIAGNOSIS — N401 Enlarged prostate with lower urinary tract symptoms: Secondary | ICD-10-CM

## 2022-05-30 DIAGNOSIS — R339 Retention of urine, unspecified: Secondary | ICD-10-CM | POA: Diagnosis not present

## 2022-05-30 DIAGNOSIS — N138 Other obstructive and reflux uropathy: Secondary | ICD-10-CM | POA: Diagnosis not present

## 2022-05-30 NOTE — Progress Notes (Cosign Needed Addendum)
Fill and Pull Catheter Removal  Patient is present today for a catheter removal.  Patient was cleaned and prepped in a sterile fashion 397m of sterile water/ saline was instilled into the bladder when the patient felt the urge to urinate. 167mof water was then drained from the balloon.  A 16FR foley cath was removed from the bladder no complications were noted .  Patient as then given some time to void on their own.  Patient cannot void on their own after some time.  Patient tolerated well.  Performed by: KoLevi AlandCMA  Follow up/ Additional notes: MD to see after.  Simple Catheter Placement  Due to urinary retention patient is present today for a foley cath placement.  Patient was cleaned and prepped in a sterile fashion with betadine and 2% lidocaine jelly was instilled into the urethra. A 18 FR coude foley catheter was inserted, urine return was noted  38026murine was yellow in color.  The balloon was filled with 10cc of sterile water.  A leg bag was attached for drainage. Patient was also given a night bag to take home and was given instruction on how to change from one bag to another.  Patient was given instruction on proper catheter care.  Patient tolerated well, no complications were noted   Performed by: KouLevi AlandMA  Additional notes/ Follow up: Follow up as scheduled.

## 2022-05-30 NOTE — Progress Notes (Signed)
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  Symptoms have been worsening over the past few years.  His neurologic diagnosis goes back about 20 years.  He was recently put on tamsulosin which has helped his urinary symptomatology.  He was also prescribed oxybutynin which he has not started yet.  This is his first visit to a urologist.   IPSS 13, quality-of-life score 5.  Residual urine volume 56 mL.   He was given samples of Gemtesa, told to continue on tamsulosin.  He was given over to bladder guide sheet.   1.16.2024: Here today for routine follow-up.  He tolerated the Gemtesa well.  Urinary symptoms improved significantly, he is quite pleased with this.  IPSS 0.   2.13.2024: Presentation with lower abdominal discomfort, uncontrolled leakage and suprapubic mass.  He had a volume of 1400 mL on his scan.  Foley catheter placed.  Creatinine baseline 1.45, today, returned 7.17.  Normal potassium.  2.20.2024: Here for follow-up.  He feels much better than at this time last week. Past Medical History:  Diagnosis Date   Coronary artery disease    Hypercholesteremia    Hypertension    NSTEMI (non-ST elevated myocardial infarction) [facility])     Past Surgical History:  Procedure Laterality Date   CERVICAL LAMINECTOMY     LUMBAR SPINE SURGERY      Home Medications:  Allergies as of 05/30/2022       Reactions   Baclofen Other (See Comments)   Bad dreams    Penicillins Other (See Comments)   "Blacked-out"        Medication List        Accurate as of May 30, 2022  8:28 AM. If you have any questions, ask your nurse or doctor.          albuterol 108 (90 Base) MCG/ACT inhaler Commonly known as: VENTOLIN HFA Inhale 2 puffs into the lungs every 6 (six) hours as needed for wheezing or shortness of  breath.   aspirin EC 81 MG tablet Take 81 mg by mouth daily.   Gemtesa 75 MG Tabs Generic drug: Vibegron Take 1 tablet (75 mg total) by mouth daily.   Gemtesa 75 MG Tabs Generic drug: Vibegron Take 1 tablet (75 mg total) by mouth daily.   metoprolol succinate 25 MG 24 hr tablet Commonly known as: TOPROL-XL Take 1 tablet (25 mg total) by mouth daily.   nitroGLYCERIN 0.4 MG SL tablet Commonly known as: NITROSTAT Place 1 tablet (0.4 mg total) under the tongue every 5 (five) minutes as needed for chest pain.   rosuvastatin 40 MG tablet Commonly known as: CRESTOR Take 1 tablet (40 mg total) by mouth daily.   solifenacin 10 MG tablet Commonly known as: VESICARE Take 1 tablet (10 mg total) by mouth daily.   tamsulosin 0.4 MG Caps capsule Commonly known as: FLOMAX Take 1 capsule (0.4 mg total) by mouth daily.        Allergies:  Allergies  Allergen Reactions   Baclofen Other (See Comments)    Bad dreams    Penicillins Other (See Comments)    "Blacked-out"    Family History  Problem Relation Age of Onset   Alzheimer's disease Mother    Aneurysm Father    COPD Sister     Social History:  reports that he has never smoked. He has never used smokeless tobacco. He  reports that he does not drink alcohol and does not use drugs.  ROS: A complete review of systems was performed.  All systems are negative except for pertinent findings as noted.  Physical Exam:  Vital signs in last 24 hours: There were no vitals taken for this visit. Constitutional:  Alert and oriented, No acute distress Cardiovascular: Regular rate  Respiratory: Normal respiratory effort GI: Abdomen is soft, nontender, nondistended, no abdominal masses. No CVAT.  Genitourinary: Normal male phallus, testes are descended bilaterally and non-tender and without masses, scrotum is normal in appearance without lesions or masses, perineum is normal on inspection. Lymphatic: No lymphadenopathy Neurologic:  Grossly intact, no focal deficits Psychiatric: Normal mood and affect  I have reviewed prior pt notes  I have independently reviewed prior imaging  I have reviewed prior basic metabolic panel   I attempted to teach patient intermittent catheterization.  Even when using a coud tip prelubricated catheter, it was hard for the patient to get past the sphincter area.  Perhaps he did have a small false passage.  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

## 2022-05-31 LAB — BASIC METABOLIC PANEL
BUN/Creatinine Ratio: 9 — ABNORMAL LOW (ref 10–24)
BUN: 13 mg/dL (ref 8–27)
CO2: 20 mmol/L (ref 20–29)
Calcium: 9.2 mg/dL (ref 8.6–10.2)
Chloride: 105 mmol/L (ref 96–106)
Creatinine, Ser: 1.43 mg/dL — ABNORMAL HIGH (ref 0.76–1.27)
Glucose: 103 mg/dL — ABNORMAL HIGH (ref 70–99)
Potassium: 4.2 mmol/L (ref 3.5–5.2)
Sodium: 142 mmol/L (ref 134–144)
eGFR: 51 mL/min/{1.73_m2} — ABNORMAL LOW (ref >59)

## 2022-06-01 ENCOUNTER — Ambulatory Visit: Payer: Medicare Other | Admitting: Family Medicine

## 2022-06-01 ENCOUNTER — Telehealth: Payer: Self-pay

## 2022-06-01 NOTE — Telephone Encounter (Signed)
Wife/Patient is aware that his kidney function is back to normal. Wife/Patient voiced understanding

## 2022-06-01 NOTE — Telephone Encounter (Signed)
-----   Message from Franchot Gallo, MD sent at 06/01/2022  2:46 PM EST ----- Let patient know that his kidney function is now back to normal.  Good news! ----- Message ----- From: Sherrilyn Rist, CMA Sent: 05/31/2022  11:51 AM EST To: Franchot Gallo, MD  Please review

## 2022-06-12 ENCOUNTER — Encounter: Payer: Self-pay | Admitting: Family Medicine

## 2022-06-12 ENCOUNTER — Ambulatory Visit (INDEPENDENT_AMBULATORY_CARE_PROVIDER_SITE_OTHER): Payer: Medicare Other | Admitting: Family Medicine

## 2022-06-12 VITALS — BP 128/75 | HR 90 | Ht 69.0 in | Wt 164.0 lb

## 2022-06-12 DIAGNOSIS — R5382 Chronic fatigue, unspecified: Secondary | ICD-10-CM

## 2022-06-12 DIAGNOSIS — E559 Vitamin D deficiency, unspecified: Secondary | ICD-10-CM | POA: Diagnosis not present

## 2022-06-12 DIAGNOSIS — E7849 Other hyperlipidemia: Secondary | ICD-10-CM

## 2022-06-12 DIAGNOSIS — E038 Other specified hypothyroidism: Secondary | ICD-10-CM | POA: Diagnosis not present

## 2022-06-12 DIAGNOSIS — R399 Unspecified symptoms and signs involving the genitourinary system: Secondary | ICD-10-CM | POA: Diagnosis not present

## 2022-06-12 DIAGNOSIS — Z1159 Encounter for screening for other viral diseases: Secondary | ICD-10-CM

## 2022-06-12 DIAGNOSIS — R7301 Impaired fasting glucose: Secondary | ICD-10-CM

## 2022-06-12 NOTE — Progress Notes (Signed)
Established Patient Office Visit  Subjective:  Patient ID: Barry Powers, male    DOB: [birth year]  Age: 77 y.o. MRN: [id number]  CC:  Chief Complaint  Patient presents with   Follow-up    3 month f/u labs ordered 04/26/22. Pt reports not doing too good feeling down lately.     HPI Barry Powers is a 77 y.o. male with past medical history of hypertension, hereditary spastic paraplegia presents for f/u of  chronic medical conditions. For the details of today's visit, please refer to the assessment and plan.     Past Medical History:  Diagnosis Date   Coronary artery disease    Hypercholesteremia    Hypertension    NSTEMI (non-ST elevated myocardial infarction) [facility])     Past Surgical History:  Procedure Laterality Date   CERVICAL LAMINECTOMY     LUMBAR SPINE SURGERY      Family History  Problem Relation Age of Onset   Alzheimer's disease Mother    Aneurysm Father    COPD Sister     Social History   Socioeconomic History   Marital status: Married    Spouse name: Not on file   Number of children: Not on file   Years of education: Not on file   Highest education level: Not on file  Occupational History   Not on file  Tobacco Use   Smoking status: Never   Smokeless tobacco: Never  Vaping Use   Vaping Use: Never used  Substance and Sexual Activity   Alcohol use: No   Drug use: No   Sexual activity: Not Currently  Other Topics Concern   Not on file  Social History Narrative   Not on file   Social Determinants of Health   Financial Resource Strain: Not on file  Food Insecurity: No Food Insecurity (03/29/2022)   Hunger Vital Sign    Worried About Running Out of Food in the Last Year: Never true    Ran Out of Food in the Last Year: Never true  Transportation Needs: No Transportation Needs (03/29/2022)   PRAPARE - Hydrologist (Medical): No    Lack of Transportation (Non-Medical): No  Physical Activity: Not on file   Stress: Not on file  Social Connections: Not on file  Intimate Partner Violence: Not At Risk (03/29/2022)   Humiliation, Afraid, Rape, and Kick questionnaire    Fear of Current or Ex-Partner: No    Emotionally Abused: No    Physically Abused: No    Sexually Abused: No    Outpatient Medications Prior to Visit  Medication Sig Dispense Refill   albuterol (VENTOLIN HFA) 108 (90 Base) MCG/ACT inhaler Inhale 2 puffs into the lungs every 6 (six) hours as needed for wheezing or shortness of breath. 8 g 2   aspirin EC 81 MG tablet Take 81 mg by mouth daily.     metoprolol succinate (TOPROL-XL) 25 MG 24 hr tablet Take 1 tablet (25 mg total) by mouth daily. 90 tablet 1   nitroGLYCERIN (NITROSTAT) 0.4 MG SL tablet Place 1 tablet (0.4 mg total) under the tongue every 5 (five) minutes as needed for chest pain. 25 tablet 3   rosuvastatin (CRESTOR) 40 MG tablet Take 1 tablet (40 mg total) by mouth daily. 90 tablet 3   solifenacin (VESICARE) 10 MG tablet Take 1 tablet (10 mg total) by mouth daily. 30 tablet 11   tamsulosin (FLOMAX) 0.4 MG CAPS capsule Take 1 capsule (0.4 mg  total) by mouth daily. 30 capsule 3   Vibegron (GEMTESA) 75 MG TABS Take 1 tablet (75 mg total) by mouth daily. 30 tablet 11   Vibegron (GEMTESA) 75 MG TABS Take 1 tablet (75 mg total) by mouth daily. 30 tablet 11   No facility-administered medications prior to visit.    Allergies  Allergen Reactions   Baclofen Other (See Comments)    Bad dreams    Penicillins Other (See Comments)    "Blacked-out"    ROS Review of Systems  Constitutional:  Positive for fatigue. Negative for fever.  Respiratory:  Negative for chest tightness, shortness of breath and wheezing.   Cardiovascular:  Negative for chest pain and palpitations.  Genitourinary:        Foley in placed  Neurological:  Negative for dizziness and headaches.      Objective:    Physical Exam Constitutional:      General: He is not in acute distress.     Appearance: He is not ill-appearing, toxic-appearing or diaphoretic.  HENT:     Head: Normocephalic.     Right Ear: External ear normal.     Left Ear: External ear normal.     Nose: No congestion or rhinorrhea.     Mouth/Throat:     Mouth: Mucous membranes are moist.  Cardiovascular:     Rate and Rhythm: Regular rhythm.     Heart sounds: No murmur heard. Pulmonary:     Effort: No respiratory distress.     Breath sounds: Normal breath sounds.  Genitourinary:    Comments: Following placed The urine is clear and without odor Neurological:     Mental Status: He is alert.     BP 128/75   Pulse 90   Ht '5\' 9"'$  (1.753 m)   Wt 164 lb (74.4 kg)   SpO2 97%   BMI 24.22 kg/m  Wt Readings from Last 3 Encounters:  06/12/22 164 lb (74.4 kg)  04/21/22 168 lb (76.2 kg)  03/28/22 167 lb 15.9 oz (76.2 kg)    No results found for: "TSH" Lab Results  Component Value Date   WBC 7.6 04/21/2022   HGB 15.1 04/21/2022   HCT 46.8 04/21/2022   MCV 87 04/21/2022   PLT 156 04/21/2022   Lab Results  Component Value Date   NA 142 05/30/2022   K 4.2 05/30/2022   CO2 20 05/30/2022   GLUCOSE 103 (H) 05/30/2022   BUN 13 05/30/2022   CREATININE 1.43 (H) 05/30/2022   BILITOT 0.9 03/28/2022   ALKPHOS 42 03/28/2022   AST 18 03/28/2022   ALT 12 03/28/2022   PROT 6.6 03/28/2022   ALBUMIN 3.5 03/28/2022   CALCIUM 9.2 05/30/2022   ANIONGAP 7 03/30/2022   EGFR 51 (L) 05/30/2022   Lab Results  Component Value Date   CHOL 266 (H) 01/31/2022   Lab Results  Component Value Date   HDL 37 (L) 01/31/2022   Lab Results  Component Value Date   LDLCALC 176 (H) 01/31/2022   Lab Results  Component Value Date   TRIG 263 (H) 01/31/2022   Lab Results  Component Value Date   CHOLHDL 7.2 01/31/2022   No results found for: "HGBA1C"    Assessment & Plan:  Chronic fatigue Assessment & Plan: No fever, chills, unintentional weight loss, nausea or vomiting reported No upper respiratory symptoms  reported Patient complains of chronic fatigue and lower extremity weakness Of note, the patient has hereditary spastic paraplegia bilaterally Denies physical therapy referral Will  assess his CBC, thyroid levels, and vitamin D levels today Will continue to monitor    Lower urinary tract symptoms (LUTS) Assessment & Plan: The patient is following up with urology Currently has a Foley in place that is due for removal tomorrow on 06/13/2022 He takes Flomax 0.4 mg daily and reports compliance with treatment regimen Encouraged to continue treatment regimen   Other hyperlipidemia Assessment & Plan: Stable on rosuvastatin 40 mg daily Reports compliance with dietary changes No muscle aches or pain reported Will assess lipid levels today Lab Results  Component Value Date   CHOL 266 (H) 01/31/2022   HDL 37 (L) 01/31/2022   LDLCALC 176 (H) 01/31/2022   TRIG 263 (H) 01/31/2022   CHOLHDL 7.2 01/31/2022     Orders: -     Lipid panel  Need for hepatitis C screening test -     Hepatitis C antibody  Other specified hypothyroidism -     TSH + free T4  Vitamin D deficiency -     VITAMIN D 25 Hydroxy (Vit-D Deficiency, Fractures)  IFG (impaired fasting glucose) -     Hemoglobin A1c    Follow-up: Return in about 3 months (around 09/12/2022).   Alvira Monday, FNP

## 2022-06-12 NOTE — Assessment & Plan Note (Signed)
The patient is following up with urology Currently has a Foley in place that is due for removal tomorrow on 06/13/2022 He takes Flomax 0.4 mg daily and reports compliance with treatment regimen Encouraged to continue treatment regimen

## 2022-06-12 NOTE — Progress Notes (Signed)
History of Present Illness:  12.5.2023:77 year old male with hereditary spastic paraplegia and subsequent mobility limitations presents for evaluation and management of urinary frequency, urgency, urgency incontinence.  Symptoms have been worsening over the past few years.  His neurologic diagnosis goes back about 20 years.  He was recently put on tamsulosin which has helped his urinary symptomatology.  He was also prescribed oxybutynin which he has not started yet.  This is his first visit to a urologist.   IPSS 13, quality-of-life score 5.  Residual urine volume 56 mL.   He was given samples of Gemtesa, told to continue on tamsulosin.  He was given over to bladder guide sheet.   1.16.2024: Here today for routine follow-up.  He tolerated the Gemtesa well.  Urinary symptoms improved significantly, he is quite pleased with this.  IPSS 0.    2.13.2024: Presentation with lower abdominal discomfort, uncontrolled leakage and suprapubic mass.  He had a volume of 1400 mL on his scan.  Foley catheter placed.  Creatinine baseline 1.45, today, returned 7.17.  Normal potassium.   2.20.2024: Here for follow-up.  He feels much better than at this time last week. Attempted cic teaching complicated by inability to introduce catheter into bladder--possible false passage. Left w/ 18 fr coude to bag.  3.5.2024: Here for repeat TOV and possible CIC teaching.  He is up to learn CIC.  He did get significant urgency with bladder filling today.  Past Medical History:  Diagnosis Date   Coronary artery disease    Hypercholesteremia    Hypertension    NSTEMI (non-ST elevated myocardial infarction) [facility])     Past Surgical History:  Procedure Laterality Date   CERVICAL LAMINECTOMY     LUMBAR SPINE SURGERY      Home Medications:  Allergies as of 06/13/2022       Reactions   Baclofen Other (See Comments)   Bad dreams    Penicillins Other (See Comments)   "Blacked-out"        Medication List         Accurate as of June 12, 2022  5:30 PM. If you have any questions, ask your nurse or doctor.          albuterol 108 (90 Base) MCG/ACT inhaler Commonly known as: VENTOLIN HFA Inhale 2 puffs into the lungs every 6 (six) hours as needed for wheezing or shortness of breath.   aspirin EC 81 MG tablet Take 81 mg by mouth daily.   Gemtesa 75 MG Tabs Generic drug: Vibegron Take 1 tablet (75 mg total) by mouth daily.   Gemtesa 75 MG Tabs Generic drug: Vibegron Take 1 tablet (75 mg total) by mouth daily.   metoprolol succinate 25 MG 24 hr tablet Commonly known as: TOPROL-XL Take 1 tablet (25 mg total) by mouth daily.   nitroGLYCERIN 0.4 MG SL tablet Commonly known as: NITROSTAT Place 1 tablet (0.4 mg total) under the tongue every 5 (five) minutes as needed for chest pain.   rosuvastatin 40 MG tablet Commonly known as: CRESTOR Take 1 tablet (40 mg total) by mouth daily.   solifenacin 10 MG tablet Commonly known as: VESICARE Take 1 tablet (10 mg total) by mouth daily.   tamsulosin 0.4 MG Caps capsule Commonly known as: FLOMAX Take 1 capsule (0.4 mg total) by mouth daily.        Allergies:  Allergies  Allergen Reactions   Baclofen Other (See Comments)    Bad dreams    Penicillins Other (See Comments)    "  Blacked-out"    Family History  Problem Relation Age of Onset   Alzheimer's disease Mother    Aneurysm Father    COPD Sister     Social History:  reports that he has never smoked. He has never used smokeless tobacco. He reports that he does not drink alcohol and does not use drugs.  ROS: A complete review of systems was performed.  All systems are negative except for pertinent findings as noted.  Physical Exam:  Vital signs in last 24 hours: There were no vitals taken for this visit. Constitutional:  Alert and oriented, No acute distress Cardiovascular: Regular rate  Respiratory: Normal respiratory effort Psychiatric: Normal mood and affect  Attempted to  CIC using a 16 French coud tip catheter with me in attendance was unsuccessful.  Catheter did not pass in to proximal urethra  Cystoscopy Procedure Note:  Indication: Urinary retention/BPH, inability to self catheterize  After informed consent and discussion of the procedure and its risks, JAEKWON LESEBERG was positioned and prepped in the standard fashion.  Cystoscopy was performed with a flexible cystoscope.   Findings: Urethra: Small false passage/flap within bulbous urethra Prostate: Trilobar hypertrophy with obstruction. Bladder neck: Open Ureteral orifices: Normal bilaterally Bladder: No urothelial lesions.  Moderate trabeculation.  The patient tolerated the procedure well.    I have reviewed prior pt notes  I have reviewed notes from referring/previous physicians  I have reviewed urinalysis results  I have independently reviewed prior imaging--MRI images from 2021.  Estimated prostate volume from [facility] calculation 57 mL     Impression/Assessment:  BPH with retention with resolved hydronephrosis/ARF  Inability to self catheterize, small false passage  Plan:  At this point, with Mr. Endrizzi not been able to self catheterize, and having what I feel is fairly normal bladder sensations at this time (even though he had a huge volume before), I do feel it is worthwhile performing TURP or another procedure to allow him to void.  Patient would prefer to have something done here in [place].  I will speak with Dr. Alyson Ingles about possible TURP.

## 2022-06-12 NOTE — Assessment & Plan Note (Signed)
Stable on rosuvastatin 40 mg daily Reports compliance with dietary changes No muscle aches or pain reported Will assess lipid levels today Lab Results  Component Value Date   CHOL 266 (H) 01/31/2022   HDL 37 (L) 01/31/2022   LDLCALC 176 (H) 01/31/2022   TRIG 263 (H) 01/31/2022   CHOLHDL 7.2 01/31/2022

## 2022-06-12 NOTE — Assessment & Plan Note (Signed)
No fever, chills, unintentional weight loss, nausea or vomiting reported No upper respiratory symptoms reported Patient complains of chronic fatigue and lower extremity weakness Of note, the patient has hereditary spastic paraplegia bilaterally Denies physical therapy referral Will assess his CBC, thyroid levels, and vitamin D levels today Will continue to monitor

## 2022-06-12 NOTE — Patient Instructions (Addendum)
   I appreciate the opportunity to provide care to you today!    Follow up:  3 months  Labs: please stop by the lab today to get your blood drawn ( TSH, Lipid profile, HgA1c, Vit D)  Please schedule Medicare Annual Wellness     Please continue to a heart-healthy diet and increase your physical activities. Try to exercise for 84mns at least five times a week.      It was a pleasure to see you and I look forward to continuing to work together on your health and well-being. Please do not hesitate to call the office if you need care or have questions about your care.   Have a wonderful day and week. With Gratitude, GAlvira MondayMSN, FNP-BC

## 2022-06-13 ENCOUNTER — Ambulatory Visit (INDEPENDENT_AMBULATORY_CARE_PROVIDER_SITE_OTHER): Payer: Medicare Other | Admitting: Urology

## 2022-06-13 ENCOUNTER — Encounter: Payer: Self-pay | Admitting: Urology

## 2022-06-13 VITALS — BP 122/74 | HR 60

## 2022-06-13 DIAGNOSIS — R339 Retention of urine, unspecified: Secondary | ICD-10-CM | POA: Diagnosis not present

## 2022-06-13 DIAGNOSIS — N138 Other obstructive and reflux uropathy: Secondary | ICD-10-CM

## 2022-06-13 DIAGNOSIS — N401 Enlarged prostate with lower urinary tract symptoms: Secondary | ICD-10-CM | POA: Diagnosis not present

## 2022-06-13 MED ORDER — CIPROFLOXACIN HCL 500 MG PO TABS
500.0000 mg | ORAL_TABLET | Freq: Once | ORAL | Status: AC
  Administered 2022-06-13: 500 mg via ORAL

## 2022-06-13 NOTE — Progress Notes (Signed)
Fill and Pull Catheter Removal  Patient is present today for a catheter removal.  Patient was cleaned and prepped in a sterile fashion 27m of sterile water/ saline was instilled into the bladder when the patient felt the urge to urinate. 110mof water was then drained from the balloon.  A 18FR coude foley cath was removed from the bladder no complications were noted .  Patient as then given some time to void on their own.  Patient cannot void. Patient tolerated well. MD in room with pt at this time.  Performed by: Odalis Jordan LPN  Follow up/ Additional notes: Per MD note

## 2022-06-13 NOTE — Patient Instructions (Signed)

## 2022-06-13 NOTE — Progress Notes (Signed)
Simple Catheter Placement  Due to urinary retention patient is present today for a foley cath placement.  Patient was cleaned and prepped in a sterile fashion with betadine and 2% lidocaine jelly was instilled into the urethra. A 18 FR coude foley catheter was inserted, urine return was noted  380m, urine was clear in color.  The balloon was filled with 10cc of sterile water.  A leg bag was attached for drainage.  Patient was given instruction on proper catheter care.  Patient tolerated well, no complications were noted   Performed by: Collins Kerby LPN  Additional notes/ Follow up: per MD note

## 2022-06-16 ENCOUNTER — Telehealth: Payer: Self-pay

## 2022-06-16 NOTE — Telephone Encounter (Signed)
I spoke with Mr. Lassila. We have discussed possible surgery dates and 07/24/2022 was agreed upon by all parties. Patient given information about surgery date, what to expect pre-operatively and post operatively.    We discussed that a pre-op nurse will be calling to set up the pre-op visit that will take place prior to surgery. Informed patient that our office will communicate any additional care to be provided after surgery.    Patients questions or concerns were discussed during our call. Advised to call our office should there be any additional information, questions or concerns that arise. Patient verbalized understanding.    Letter mailed for instructions as well

## 2022-06-26 ENCOUNTER — Ambulatory Visit (INDEPENDENT_AMBULATORY_CARE_PROVIDER_SITE_OTHER): Payer: Medicare Other | Admitting: Urology

## 2022-06-26 DIAGNOSIS — T83038A Leakage of other indwelling urethral catheter, initial encounter: Secondary | ICD-10-CM

## 2022-06-26 DIAGNOSIS — R339 Retention of urine, unspecified: Secondary | ICD-10-CM

## 2022-06-26 NOTE — Progress Notes (Signed)
Patient in office due to leg bag leaking. New leg replaced.

## 2022-07-15 ENCOUNTER — Other Ambulatory Visit: Payer: Self-pay | Admitting: Family Medicine

## 2022-07-15 DIAGNOSIS — R399 Unspecified symptoms and signs involving the genitourinary system: Secondary | ICD-10-CM

## 2022-07-20 ENCOUNTER — Other Ambulatory Visit: Payer: Self-pay

## 2022-07-20 ENCOUNTER — Encounter (HOSPITAL_COMMUNITY): Payer: Self-pay

## 2022-07-20 ENCOUNTER — Encounter (HOSPITAL_COMMUNITY)
Admission: RE | Admit: 2022-07-20 | Discharge: 2022-07-20 | Disposition: A | Discharge status: Home / Self Care | Payer: Medicare Other | Source: Ambulatory Visit | Attending: Urology | Admitting: Urology

## 2022-07-24 ENCOUNTER — Ambulatory Visit (HOSPITAL_BASED_OUTPATIENT_CLINIC_OR_DEPARTMENT_OTHER): Payer: Medicare Other | Admitting: Anesthesiology

## 2022-07-24 ENCOUNTER — Observation Stay (HOSPITAL_COMMUNITY)
Admission: RE | Admit: 2022-07-24 | Discharge: 2022-07-25 | Disposition: A | Discharge status: Home / Self Care | Payer: Medicare Other | Attending: Urology | Admitting: Urology

## 2022-07-24 ENCOUNTER — Other Ambulatory Visit: Payer: Self-pay

## 2022-07-24 ENCOUNTER — Encounter (HOSPITAL_COMMUNITY)
Admission: RE | Disposition: A | Discharge status: Home / Self Care | Payer: Self-pay | Source: Home / Self Care | Attending: Urology

## 2022-07-24 ENCOUNTER — Ambulatory Visit (HOSPITAL_COMMUNITY): Payer: Medicare Other | Admitting: Anesthesiology

## 2022-07-24 ENCOUNTER — Encounter (HOSPITAL_COMMUNITY): Payer: Self-pay | Admitting: Urology

## 2022-07-24 DIAGNOSIS — N138 Other obstructive and reflux uropathy: Secondary | ICD-10-CM

## 2022-07-24 DIAGNOSIS — Z7982 Long term (current) use of aspirin: Secondary | ICD-10-CM | POA: Insufficient documentation

## 2022-07-24 DIAGNOSIS — Z79899 Other long term (current) drug therapy: Secondary | ICD-10-CM | POA: Diagnosis not present

## 2022-07-24 DIAGNOSIS — I2510 Atherosclerotic heart disease of native coronary artery without angina pectoris: Secondary | ICD-10-CM

## 2022-07-24 DIAGNOSIS — I10 Essential (primary) hypertension: Secondary | ICD-10-CM | POA: Insufficient documentation

## 2022-07-24 DIAGNOSIS — N401 Enlarged prostate with lower urinary tract symptoms: Secondary | ICD-10-CM | POA: Diagnosis not present

## 2022-07-24 DIAGNOSIS — I252 Old myocardial infarction: Secondary | ICD-10-CM

## 2022-07-24 DIAGNOSIS — R350 Frequency of micturition: Secondary | ICD-10-CM

## 2022-07-24 DIAGNOSIS — N400 Enlarged prostate without lower urinary tract symptoms: Secondary | ICD-10-CM | POA: Diagnosis not present

## 2022-07-24 DIAGNOSIS — R338 Other retention of urine: Secondary | ICD-10-CM | POA: Diagnosis not present

## 2022-07-24 HISTORY — PX: TRANSURETHRAL RESECTION OF PROSTATE: SHX73

## 2022-07-24 HISTORY — PX: CYSTOSCOPY: SHX5120

## 2022-07-24 LAB — BASIC METABOLIC PANEL
Anion gap: 5 (ref 5–15)
BUN: 14 mg/dL (ref 8–23)
CO2: 22 mmol/L (ref 22–32)
Calcium: 8.4 mg/dL — ABNORMAL LOW (ref 8.9–10.3)
Chloride: 110 mmol/L (ref 98–111)
Creatinine, Ser: 1.45 mg/dL — ABNORMAL HIGH (ref 0.61–1.24)
GFR, Estimated: 50 mL/min — ABNORMAL LOW (ref >60)
Glucose, Bld: 134 mg/dL — ABNORMAL HIGH (ref 70–99)
Potassium: 4.5 mmol/L (ref 3.5–5.1)
Sodium: 137 mmol/L (ref 135–145)

## 2022-07-24 LAB — CBC
HCT: 42.8 % (ref 39.0–52.0)
Hemoglobin: 13.5 g/dL (ref 13.0–17.0)
MCH: 28.4 pg (ref 26.0–34.0)
MCHC: 31.5 g/dL (ref 30.0–36.0)
MCV: 90.1 fL (ref 80.0–100.0)
Platelets: 156 10*3/uL (ref 150–400)
RBC: 4.75 MIL/uL (ref 4.22–5.81)
RDW: 16.6 % — ABNORMAL HIGH (ref 11.5–15.5)
WBC: 10.3 10*3/uL (ref 4.0–10.5)
nRBC: 0 % (ref 0.0–0.2)

## 2022-07-24 SURGERY — CYSTOSCOPY
Anesthesia: General | Site: Prostate

## 2022-07-24 MED ORDER — ORAL CARE MOUTH RINSE: 15.0000 mL | Freq: Once | OROMUCOSAL | Status: AC

## 2022-07-24 MED ORDER — SODIUM CHLORIDE 0.9 % IV SOLN
2.0000 g | INTRAVENOUS | Status: AC
  Administered 2022-07-24: 2 g via INTRAVENOUS

## 2022-07-24 MED ORDER — ALBUTEROL SULFATE HFA 108 (90 BASE) MCG/ACT IN AERS: 2.0000 | INHALATION_SPRAY | Freq: Four times a day (QID) | RESPIRATORY_TRACT | Status: DC | PRN

## 2022-07-24 MED ORDER — ONDANSETRON HCL 4 MG/2ML IJ SOLN
INTRAMUSCULAR | Status: DC | PRN
  Administered 2022-07-24: 4 mg via INTRAVENOUS

## 2022-07-24 MED ORDER — GLYCOPYRROLATE PF 0.2 MG/ML IJ SOSY
PREFILLED_SYRINGE | INTRAMUSCULAR | Status: AC
  Filled 2022-07-24: qty 1

## 2022-07-24 MED ORDER — DIPHENHYDRAMINE HCL 12.5 MG/5ML PO ELIX: 12.5000 mg | ORAL_SOLUTION | Freq: Four times a day (QID) | ORAL | Status: DC | PRN

## 2022-07-24 MED ORDER — SODIUM CHLORIDE 0.9 % IV SOLN: Freq: Once | INTRAVENOUS | Status: AC

## 2022-07-24 MED ORDER — ROSUVASTATIN CALCIUM 20 MG PO TABS
40.0000 mg | ORAL_TABLET | Freq: Every day | ORAL | Status: DC
  Administered 2022-07-24 – 2022-07-25 (×2): 40 mg via ORAL
  Filled 2022-07-24 (×2): qty 1
  Filled 2022-07-24 (×2): qty 2

## 2022-07-24 MED ORDER — SODIUM CHLORIDE 0.9 % IV SOLN
INTRAVENOUS | Status: AC
  Filled 2022-07-24: qty 20

## 2022-07-24 MED ORDER — DEXAMETHASONE SODIUM PHOSPHATE 10 MG/ML IJ SOLN
INTRAMUSCULAR | Status: AC
  Filled 2022-07-24: qty 3

## 2022-07-24 MED ORDER — DIPHENHYDRAMINE HCL 50 MG/ML IJ SOLN: 12.5000 mg | Freq: Four times a day (QID) | INTRAMUSCULAR | Status: DC | PRN

## 2022-07-24 MED ORDER — DEXAMETHASONE SODIUM PHOSPHATE 10 MG/ML IJ SOLN
INTRAMUSCULAR | Status: DC | PRN
  Administered 2022-07-24: 5 mg via INTRAVENOUS

## 2022-07-24 MED ORDER — PHENYLEPHRINE 80 MCG/ML (10ML) SYRINGE FOR IV PUSH (FOR BLOOD PRESSURE SUPPORT)
PREFILLED_SYRINGE | INTRAVENOUS | Status: AC
  Filled 2022-07-24: qty 30

## 2022-07-24 MED ORDER — EPHEDRINE SULFATE (PRESSORS) 50 MG/ML IJ SOLN
INTRAMUSCULAR | Status: DC | PRN
  Administered 2022-07-24 (×3): 5 mg via INTRAVENOUS
  Administered 2022-07-24: 10 mg via INTRAVENOUS

## 2022-07-24 MED ORDER — LIDOCAINE HCL (CARDIAC) PF 100 MG/5ML IV SOSY
PREFILLED_SYRINGE | INTRAVENOUS | Status: DC | PRN
  Administered 2022-07-24: 60 mg via INTRAVENOUS

## 2022-07-24 MED ORDER — GLYCOPYRROLATE 0.2 MG/ML IJ SOLN
INTRAMUSCULAR | Status: DC | PRN
  Administered 2022-07-24: .1 mg via INTRAVENOUS

## 2022-07-24 MED ORDER — ALBUTEROL SULFATE (2.5 MG/3ML) 0.083% IN NEBU: 2.5000 mg | INHALATION_SOLUTION | Freq: Four times a day (QID) | RESPIRATORY_TRACT | Status: DC | PRN

## 2022-07-24 MED ORDER — EPHEDRINE 5 MG/ML INJ
INTRAVENOUS | Status: AC
  Filled 2022-07-24: qty 5

## 2022-07-24 MED ORDER — SODIUM CHLORIDE 0.9 % IV SOLN: INTRAVENOUS | Status: DC | PRN

## 2022-07-24 MED ORDER — LACTATED RINGERS IV SOLN: INTRAVENOUS | Status: DC | PRN

## 2022-07-24 MED ORDER — METOPROLOL SUCCINATE ER 25 MG PO TB24
25.0000 mg | ORAL_TABLET | Freq: Every day | ORAL | Status: DC
  Administered 2022-07-24 – 2022-07-25 (×2): 25 mg via ORAL
  Filled 2022-07-24 (×4): qty 1

## 2022-07-24 MED ORDER — LIDOCAINE HCL (PF) 2 % IJ SOLN
INTRAMUSCULAR | Status: AC
  Filled 2022-07-24: qty 25

## 2022-07-24 MED ORDER — PROPOFOL 10 MG/ML IV BOLUS
INTRAVENOUS | Status: AC
  Filled 2022-07-24: qty 20

## 2022-07-24 MED ORDER — SODIUM CHLORIDE 0.9 % IR SOLN
Status: DC | PRN
  Administered 2022-07-24 (×7): 3000 mL

## 2022-07-24 MED ORDER — SODIUM CHLORIDE 0.9 % IV SOLN: INTRAVENOUS | Status: DC

## 2022-07-24 MED ORDER — FENTANYL CITRATE (PF) 250 MCG/5ML IJ SOLN
INTRAMUSCULAR | Status: DC | PRN
  Administered 2022-07-24 (×5): 25 ug via INTRAVENOUS

## 2022-07-24 MED ORDER — WATER FOR IRRIGATION, STERILE IR SOLN
Status: DC | PRN
  Administered 2022-07-24: 1000 mL

## 2022-07-24 MED ORDER — SENNOSIDES-DOCUSATE SODIUM 8.6-50 MG PO TABS
2.0000 | ORAL_TABLET | Freq: Every day | ORAL | Status: DC
  Administered 2022-07-24: 2 via ORAL
  Filled 2022-07-24 (×2): qty 2

## 2022-07-24 MED ORDER — PHENYLEPHRINE 80 MCG/ML (10ML) SYRINGE FOR IV PUSH (FOR BLOOD PRESSURE SUPPORT)
PREFILLED_SYRINGE | INTRAVENOUS | Status: DC | PRN
  Administered 2022-07-24 (×2): 80 ug via INTRAVENOUS
  Administered 2022-07-24 (×3): 160 ug via INTRAVENOUS
  Administered 2022-07-24 (×4): 80 ug via INTRAVENOUS

## 2022-07-24 MED ORDER — ASPIRIN 81 MG PO TBEC
81.0000 mg | DELAYED_RELEASE_TABLET | Freq: Every day | ORAL | Status: DC
  Administered 2022-07-25: 81 mg via ORAL
  Filled 2022-07-24 (×2): qty 1

## 2022-07-24 MED ORDER — HYDROMORPHONE HCL 1 MG/ML IJ SOLN: 0.2500 mg | INTRAMUSCULAR | Status: DC | PRN

## 2022-07-24 MED ORDER — ZOLPIDEM TARTRATE 5 MG PO TABS: 5.0000 mg | ORAL_TABLET | Freq: Every evening | ORAL | Status: DC | PRN

## 2022-07-24 MED ORDER — FENTANYL CITRATE (PF) 100 MCG/2ML IJ SOLN
INTRAMUSCULAR | Status: AC
  Filled 2022-07-24: qty 2

## 2022-07-24 MED ORDER — ACETAMINOPHEN 325 MG PO TABS: 650.0000 mg | ORAL_TABLET | ORAL | Status: DC | PRN

## 2022-07-24 MED ORDER — ONDANSETRON HCL 4 MG/2ML IJ SOLN: 4.0000 mg | Freq: Once | INTRAMUSCULAR | Status: DC | PRN

## 2022-07-24 MED ORDER — ONDANSETRON HCL 4 MG/2ML IJ SOLN
4.0000 mg | INTRAMUSCULAR | Status: DC | PRN
  Administered 2022-07-25: 4 mg via INTRAVENOUS
  Filled 2022-07-24: qty 2

## 2022-07-24 MED ORDER — CHLORHEXIDINE GLUCONATE 0.12 % MT SOLN
15.0000 mL | Freq: Once | OROMUCOSAL | Status: AC
  Administered 2022-07-24: 15 mL via OROMUCOSAL

## 2022-07-24 MED ORDER — HYDROMORPHONE HCL 1 MG/ML IJ SOLN
0.5000 mg | INTRAMUSCULAR | Status: DC | PRN
  Administered 2022-07-25: 1 mg via INTRAVENOUS
  Filled 2022-07-24: qty 1

## 2022-07-24 MED ORDER — OXYCODONE HCL 5 MG PO TABS: 5.0000 mg | ORAL_TABLET | ORAL | Status: DC | PRN

## 2022-07-24 MED ORDER — SODIUM CHLORIDE 0.9 % IR SOLN
3000.0000 mL | Status: DC
  Administered 2022-07-24: 3000 mL

## 2022-07-24 MED ORDER — HYOSCYAMINE SULFATE 0.125 MG SL SUBL
0.1250 mg | SUBLINGUAL_TABLET | SUBLINGUAL | Status: DC | PRN
  Administered 2022-07-25: 0.125 mg via SUBLINGUAL
  Filled 2022-07-24: qty 1

## 2022-07-24 MED ORDER — PROPOFOL 10 MG/ML IV BOLUS
INTRAVENOUS | Status: DC | PRN
  Administered 2022-07-24: 150 mg via INTRAVENOUS

## 2022-07-24 MED ORDER — ONDANSETRON HCL 4 MG/2ML IJ SOLN
INTRAMUSCULAR | Status: AC
  Filled 2022-07-24: qty 8

## 2022-07-24 SURGICAL SUPPLY — 26 items
BAG DRAIN URO TABLE W/ADPT NS (BAG) ×2 IMPLANT
BAG DRN 8 ADPR NS SKTRN CSTL (BAG) ×2
BAG DRN URN TUBE DRIP CHMBR (OSTOMY) ×4
BAG HAMPER (MISCELLANEOUS) ×2 IMPLANT
BAG URINE DRAIN TURP 4L (OSTOMY) ×2 IMPLANT
CATH FOLEY 3WAY 30CC 22F (CATHETERS) ×2 IMPLANT
CATH FOLEY 3WAY 30CC 22FR (CATHETERS) IMPLANT
CLOTH BEACON ORANGE TIMEOUT ST (SAFETY) ×2 IMPLANT
ELECT REM PT RETURN 9FT ADLT (ELECTROSURGICAL) ×2
ELECTRODE REM PT RTRN 9FT ADLT (ELECTROSURGICAL) ×2 IMPLANT
GLOVE BIO SURGEON STRL SZ8 (GLOVE) ×2 IMPLANT
GLOVE BIOGEL PI IND STRL 7.0 (GLOVE) ×4 IMPLANT
GOWN STRL REUS W/TWL LRG LVL3 (GOWN DISPOSABLE) ×4 IMPLANT
GOWN STRL REUS W/TWL XL LVL3 (GOWN DISPOSABLE) ×2 IMPLANT
IV NS IRRIG 3000ML ARTHROMATIC (IV SOLUTION) ×8 IMPLANT
KIT TURNOVER CYSTO (KITS) ×2 IMPLANT
LOOP CUT BIPOLAR 24F LRG (ELECTROSURGICAL) ×2 IMPLANT
MANIFOLD NEPTUNE II (INSTRUMENTS) ×2 IMPLANT
PACK CYSTO (CUSTOM PROCEDURE TRAY) ×2 IMPLANT
PAD ARMBOARD 7.5X6 YLW CONV (MISCELLANEOUS) ×2 IMPLANT
SYR 30ML LL (SYRINGE) ×2 IMPLANT
SYR TOOMEY IRRIG 70ML (MISCELLANEOUS) ×2
SYRINGE TOOMEY IRRIG 70ML (MISCELLANEOUS) ×2 IMPLANT
TOWEL NATURAL 4PK STERILE (DISPOSABLE) ×2 IMPLANT
TOWEL OR 17X26 4PK STRL BLUE (TOWEL DISPOSABLE) ×2 IMPLANT
WATER STERILE IRR 500ML POUR (IV SOLUTION) ×2 IMPLANT

## 2022-07-24 NOTE — H&P (Signed)
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  Symptoms have been worsening over the past few years.  His neurologic diagnosis goes back about 20 years.  He was recently put on tamsulosin which has helped his urinary symptomatology.  He was also prescribed oxybutynin which he has not started yet.  This is his first visit to a urologist.   IPSS 13, quality-of-life score 5.  Residual urine volume 56 mL.   He was given samples of Gemtesa, told to continue on tamsulosin.  He was given over to bladder guide sheet.   1.16.2024: Here today for routine follow-up.  He tolerated the Gemtesa well.  Urinary symptoms improved significantly, he is quite pleased with this.  IPSS 0.    2.13.2024: Presentation with lower abdominal discomfort, uncontrolled leakage and suprapubic mass.  He had a volume of 1400 mL on his scan.  Foley catheter placed.  Creatinine baseline 1.45, today, returned 7.17.  Normal potassium.   2.20.2024: Here for follow-up.  He feels much better than at this time last week. Attempted cic teaching complicated by inability to introduce catheter into bladder--possible false passage. Left w/ 18 fr coude to bag.   3.5.2024: Here for repeat TOV and possible CIC teaching.  He is up to learn CIC.  He did get significant urgency with bladder filling today.  07/24/2022: Patient presents today for TURP. He has a chronic indwelling foley       Past Medical History:  Diagnosis Date   Coronary artery disease     Hypercholesteremia     Hypertension     NSTEMI (non-ST elevated myocardial infarction) [facility])             Past Surgical History:  Procedure Laterality Date   CERVICAL LAMINECTOMY       LUMBAR SPINE SURGERY          Home Medications:  Allergies as of 06/13/2022         Reactions    Baclofen Other (See Comments)     Bad dreams     Penicillins Other (See Comments)    "Blacked-out"            Medication List           Accurate as of June 12, 2022  5:30 PM. If you have any questions, ask your nurse or doctor.              albuterol 108 (90 Base) MCG/ACT inhaler Commonly known as: VENTOLIN HFA Inhale 2 puffs into the lungs every 6 (six) hours as needed for wheezing or shortness of breath.    aspirin EC 81 MG tablet Take 81 mg by mouth daily.    Gemtesa 75 MG Tabs Generic drug: Vibegron Take 1 tablet (75 mg total) by mouth daily.    Gemtesa 75 MG Tabs Generic drug: Vibegron Take 1 tablet (75 mg total) by mouth daily.    metoprolol succinate 25 MG 24 hr tablet Commonly known as: TOPROL-XL Take 1 tablet (25 mg total) by mouth daily.    nitroGLYCERIN 0.4 MG SL tablet Commonly known as: NITROSTAT Place 1 tablet (0.4 mg total) under the tongue every 5 (five) minutes as needed for chest pain.    rosuvastatin 40 MG tablet Commonly known as: CRESTOR Take 1 tablet (40 mg total) by mouth daily.    solifenacin 10 MG tablet Commonly known as: VESICARE Take  1 tablet (10 mg total) by mouth daily.    tamsulosin 0.4 MG Caps capsule Commonly known as: FLOMAX Take 1 capsule (0.4 mg total) by mouth daily.             Allergies:       Allergies  Allergen Reactions   Baclofen Other (See Comments)      Bad dreams    Penicillins Other (See Comments)      "Blacked-out"           Family History  Problem Relation Age of Onset   Alzheimer's disease Mother     Aneurysm Father     COPD Sister        Social History:  reports that he has never smoked. He has never used smokeless tobacco. He reports that he does not drink alcohol and does not use drugs.   ROS: A complete review of systems was performed.  All systems are negative except for pertinent findings as noted.   Physical Exam:  Vital signs in last 24 hours: There were no vitals taken for this visit. Constitutional:  Alert  and oriented, No acute distress Cardiovascular: Regular rate  Respiratory: Normal respiratory effort Psychiatric: Normal mood and affect   Attempted to CIC using a 16 French coud tip catheter with me in attendance was unsuccessful.  Catheter did not pass in to proximal urethra       Impression/Assessment:  BPH with retention with resolved hydronephrosis/ARF   Inability to self catheterize, small false passage   Plan: We discussed the management of his BPH including continued medical therapy, Rezum, Urolift, TURP and simple prostatectomy. After discussing the options the patient has elected to proceed with TURP. Risks/benefits/alternatives discussed.

## 2022-07-24 NOTE — Transfer of Care (Signed)
Immediate Anesthesia Transfer of Care Note  Patient: Barry Powers  Procedure(s) Performed: CYSTOSCOPY (Bladder) TRANSURETHRAL RESECTION OF THE PROSTATE (TURP) (Prostate)  Patient Location: PACU  Anesthesia Type:General  Level of Consciousness: drowsy  Airway & Oxygen Therapy: Patient Spontanous Breathing and Patient connected to nasal cannula oxygen  Post-op Assessment: Report given to RN and Post -op Vital signs reviewed and stable  Post vital signs: Reviewed and stable  Last Vitals:  Vitals Value Taken Time  BP 117/64   Temp    Pulse 69 07/24/22 1515  Resp 12 07/24/22 1515  SpO2 100 % 07/24/22 1515  Vitals shown include unvalidated device data.  Last Pain:  Vitals:   07/24/22 1341  TempSrc: Oral  PainSc:       Patients Stated Pain Goal: 4 (07/24/22 1224)  Complications: No notable events documented.

## 2022-07-24 NOTE — Op Note (Signed)
Preoperative diagnosis: BPH  Postoperative diagnosis: BPH  Procedure: 1 cystoscopy 2. Transurethral resection of the prostate  Attending: [name]  Anesthesia: General  Estimated blood loss: Minimal  Drains: 22 French foley  Specimens: 1. Prostate Chips  Antibiotics: Rocephin  Findings: Trilobar prostate enlargement. Ureteral orifices in normal anatomic location.   Indications: Patient is a 77 year old male with a history of BPH and urinary retention managed with a chronic foley.  After discussing treatment options, they decided proceed with transurethral resection of the prostate.  Procedure in detail: The patient was brought to the operating room and a brief timeout was done to ensure correct patient, correct procedure, correct site.  General anesthesia was administered patient was placed in dorsal lithotomy position.  Their genitalia was then prepped and draped in usual sterile fashion.  A rigid 22 French cystoscope was passed in the urethra and the bladder.  Bladder was inspected and we noted no masses or lesions.  the ureteral orifices were in the normal orthotopic locations. removed the cystoscope and placed a resectoscope into the bladder. We then turned our attention to the prostate resection. Using the bipolar resectoscope we resected the median lobe first from the bladder neck to the verumontanum. We then started at the 12 oclock position on the left lobe and resection to the 6 o'clock position from the bladder neck to the verumontanum. We then did the same resection of the right lobe. Once the resection was complete we then cauterized individual bleeders. We then removed the prostate chips and sent them for pathology.  We then re-inspected the prostatic fossa and found no residual bleeding.  the bladder was then drained, a 22 French foley was placed and this concluded the procedure which was well tolerated by patient.  Complications: None  Condition: Stable, extubated,  transferred to PACU  Plan: Patient is admitted overnight with continuous bladder irrigation. If their urine is clear tomorrow they will be discharged home and followup in 5 days for foley catheter removal and pathology discussion.

## 2022-07-24 NOTE — Anesthesia Postprocedure Evaluation (Signed)
Anesthesia Post Note  Patient: Barry Powers  Procedure(s) Performed: CYSTOSCOPY (Bladder) TRANSURETHRAL RESECTION OF THE PROSTATE (TURP) (Prostate)  Patient location during evaluation: Phase II Anesthesia Type: General Level of consciousness: awake and alert and oriented Pain management: pain level controlled Vital Signs Assessment: post-procedure vital signs reviewed and stable Respiratory status: spontaneous breathing, nonlabored ventilation and respiratory function stable Cardiovascular status: blood pressure returned to baseline and stable Postop Assessment: no apparent nausea or vomiting Anesthetic complications: no  No notable events documented.   Last Vitals:  Vitals:   07/24/22 1600 07/24/22 1636  BP: (!) 142/75 135/61  Pulse: 71 68  Resp: 16 18  Temp:  36.5 C  SpO2: 100% 94%    Last Pain:  Vitals:   07/24/22 1636  TempSrc: Oral  PainSc:                  Jennie Bolar C Norell Brisbin

## 2022-07-24 NOTE — Anesthesia Preprocedure Evaluation (Signed)
Anesthesia Evaluation  Patient identified by MRN, date of birth, ID band Patient awake    Reviewed: Allergy & Precautions, H&P , NPO status , Patient's Chart, lab work & pertinent test results  Airway Mallampati: III  TM Distance: >3 FB Neck ROM: Full  Mouth opening: Limited Mouth Opening  Dental  (+) Dental Advisory Given, Missing, Chipped   Pulmonary pneumonia   Pulmonary exam normal breath sounds clear to auscultation       Cardiovascular hypertension, Pt. on medications + CAD and + Past MI  Normal cardiovascular exam Rhythm:Regular Rate:Normal     Neuro/Psych  Neuromuscular disease  negative psych ROS   GI/Hepatic negative GI ROS, Neg liver ROS,,,  Endo/Other  negative endocrine ROS    Renal/GU negative Renal ROS  negative genitourinary   Musculoskeletal negative musculoskeletal ROS (+)    Abdominal   Peds negative pediatric ROS (+)  Hematology negative hematology ROS (+)   Anesthesia Other Findings   Reproductive/Obstetrics negative OB ROS                             Anesthesia Physical Anesthesia Plan  ASA: 3  Anesthesia Plan: General   Post-op Pain Management: Dilaudid IV   Induction: Intravenous  PONV Risk Score and Plan: 4 or greater and Ondansetron, Dexamethasone and Midazolam  Airway Management Planned: Oral ETT and Video Laryngoscope Planned  Additional Equipment:   Intra-op Plan:   Post-operative Plan: Extubation in OR  Informed Consent: I have reviewed the patients History and Physical, chart, labs and discussed the procedure including the risks, benefits and alternatives for the proposed anesthesia with the patient or authorized representative who has indicated his/her understanding and acceptance.     Dental advisory given  Plan Discussed with: CRNA and Surgeon  Anesthesia Plan Comments:         Anesthesia Quick Evaluation

## 2022-07-24 NOTE — Anesthesia Procedure Notes (Signed)
Procedure Name: LMA Insertion Date/Time: 07/24/2022 1:58 PM  Performed by: Lorin Glass, CRNAPre-anesthesia Checklist: Emergency Drugs available, Patient identified, Suction available and Patient being monitored Patient Re-evaluated:Patient Re-evaluated prior to induction Oxygen Delivery Method: Circle system utilized Preoxygenation: Pre-oxygenation with 100% oxygen Induction Type: IV induction LMA: LMA inserted LMA Size: 4.0 Number of attempts: 1 Placement Confirmation: positive ETCO2 and breath sounds checked- equal and bilateral Tube secured with: Tape Dental Injury: Teeth and Oropharynx as per pre-operative assessment

## 2022-07-25 ENCOUNTER — Ambulatory Visit (INDEPENDENT_AMBULATORY_CARE_PROVIDER_SITE_OTHER): Payer: Medicare Other | Admitting: Urology

## 2022-07-25 DIAGNOSIS — N400 Enlarged prostate without lower urinary tract symptoms: Secondary | ICD-10-CM | POA: Diagnosis not present

## 2022-07-25 DIAGNOSIS — R339 Retention of urine, unspecified: Secondary | ICD-10-CM

## 2022-07-25 DIAGNOSIS — I2510 Atherosclerotic heart disease of native coronary artery without angina pectoris: Secondary | ICD-10-CM | POA: Diagnosis not present

## 2022-07-25 DIAGNOSIS — Z79899 Other long term (current) drug therapy: Secondary | ICD-10-CM | POA: Diagnosis not present

## 2022-07-25 DIAGNOSIS — I10 Essential (primary) hypertension: Secondary | ICD-10-CM | POA: Diagnosis not present

## 2022-07-25 DIAGNOSIS — Z7982 Long term (current) use of aspirin: Secondary | ICD-10-CM | POA: Diagnosis not present

## 2022-07-25 LAB — BASIC METABOLIC PANEL
Anion gap: 5 (ref 5–15)
BUN: 19 mg/dL (ref 8–23)
CO2: 23 mmol/L (ref 22–32)
Calcium: 8.1 mg/dL — ABNORMAL LOW (ref 8.9–10.3)
Chloride: 108 mmol/L (ref 98–111)
Creatinine, Ser: 1.47 mg/dL — ABNORMAL HIGH (ref 0.61–1.24)
GFR, Estimated: 49 mL/min — ABNORMAL LOW (ref >60)
Glucose, Bld: 126 mg/dL — ABNORMAL HIGH (ref 70–99)
Potassium: 5 mmol/L (ref 3.5–5.1)
Sodium: 136 mmol/L (ref 135–145)

## 2022-07-25 LAB — CBC
HCT: 37.5 % — ABNORMAL LOW (ref 39.0–52.0)
Hemoglobin: 12 g/dL — ABNORMAL LOW (ref 13.0–17.0)
MCH: 28.4 pg (ref 26.0–34.0)
MCHC: 32 g/dL (ref 30.0–36.0)
MCV: 88.9 fL (ref 80.0–100.0)
Platelets: 161 10*3/uL (ref 150–400)
RBC: 4.22 MIL/uL (ref 4.22–5.81)
RDW: 16.6 % — ABNORMAL HIGH (ref 11.5–15.5)
WBC: 16.8 10*3/uL — ABNORMAL HIGH (ref 4.0–10.5)
nRBC: 0 % (ref 0.0–0.2)

## 2022-07-25 MED ORDER — HYDROCODONE-ACETAMINOPHEN 5-325 MG PO TABS: 1.0000 | ORAL_TABLET | Freq: Four times a day (QID) | ORAL | 0 refills | Status: DC | PRN

## 2022-07-25 NOTE — TOC Progression Note (Signed)
Transition of Care [facility]) - Progression Note    Patient Details  Name: Barry Powers MRN: [id number] Date of Birth: 1945/10/03  Transition of Care [facility]) CM/SW Contact  Karn Cassis, [place] Phone Number: 07/25/2022, 10:03 AM  Clinical Narrative:   Transition of Care [facility]) Screening Note   Patient Details  Name: Barry Powers Date of Birth: [birth year]   Transition of Care [facility]) CM/SW Contact:    Karn Cassis, LCSW Phone Number: 07/25/2022, 10:03 AM    Transition of Care [facility]) has reviewed patient and no TOC needs have been identified at this time. We will continue to monitor patient advancement through interdisciplinary progression rounds. If new patient transition needs arise, please place a TOC consult.         Barriers to Discharge: Continued Medical Work up  Expected Discharge Plan and Services                                               Social Determinants of Health (SDOH) Interventions SDOH Screenings   Food Insecurity: No Food Insecurity (07/24/2022)  Housing: Low Risk  (07/24/2022)  Transportation Needs: No Transportation Needs (07/24/2022)  Utilities: Not At Risk (07/24/2022)  Depression (PHQ2-9): High Risk (06/12/2022)  Tobacco Use: Low Risk  (07/24/2022)    Readmission Risk Interventions     No data to display

## 2022-07-25 NOTE — Plan of Care (Signed)
?  Problem: Education: ?Goal: Knowledge of the prescribed therapeutic regimen will improve ?Outcome: Adequate for Discharge ?  ?Problem: Bowel/Gastric: ?Goal: Gastrointestinal status for postoperative course will improve ?Outcome: Adequate for Discharge ?  ?Problem: Health Behavior/Discharge Planning: ?Goal: Identification of resources available to assist in meeting health care needs will improve ?Outcome: Adequate for Discharge ?  ?Problem: Skin Integrity: ?Goal: Demonstration of wound healing without infection will improve ?Outcome: Adequate for Discharge ?  ?Problem: Urinary Elimination: ?Goal: Ability to avoid or minimize complications of infection will improve ?Outcome: Adequate for Discharge ?  ?Problem: Education: ?Goal: Knowledge of General Education information will improve ?Description: Including pain rating scale, medication(s)/side effects and non-pharmacologic comfort measures ?Outcome: Adequate for Discharge ?  ?Problem: Health Behavior/Discharge Planning: ?Goal: Ability to manage health-related needs will improve ?Outcome: Adequate for Discharge ?  ?Problem: Clinical Measurements: ?Goal: Ability to maintain clinical measurements within normal limits will improve ?Outcome: Adequate for Discharge ?Goal: Will remain free from infection ?Outcome: Adequate for Discharge ?Goal: Diagnostic test results will improve ?Outcome: Adequate for Discharge ?Goal: Respiratory complications will improve ?Outcome: Adequate for Discharge ?Goal: Cardiovascular complication will be avoided ?Outcome: Adequate for Discharge ?  ?Problem: Activity: ?Goal: Risk for activity intolerance will decrease ?Outcome: Adequate for Discharge ?  ?Problem: Nutrition: ?Goal: Adequate nutrition will be maintained ?Outcome: Adequate for Discharge ?  ?Problem: Coping: ?Goal: Level of anxiety will decrease ?Outcome: Adequate for Discharge ?  ?Problem: Elimination: ?Goal: Will not experience complications related to bowel motility ?Outcome:  Adequate for Discharge ?Goal: Will not experience complications related to urinary retention ?Outcome: Adequate for Discharge ?  ?Problem: Pain Managment: ?Goal: General experience of comfort will improve ?Outcome: Adequate for Discharge ?  ?Problem: Safety: ?Goal: Ability to remain free from injury will improve ?Outcome: Adequate for Discharge ?  ?Problem: Skin Integrity: ?Goal: Risk for impaired skin integrity will decrease ?Outcome: Adequate for Discharge ?  ?

## 2022-07-25 NOTE — Progress Notes (Signed)
Patient present in office requesting leg bag. Patient taught how to change from a bedside bag to a leg bag.

## 2022-07-25 NOTE — Progress Notes (Signed)
CBI fluid discontinued and port on foley bag plugged. Pt with dark pink urine in foley cath, emptied prior to discharge. Pt assisted to get dressed, very unsteady on his feet. Pt discharged via WC to POV with personal belongings in his possession. Discharge instructions reviewed with both pt and daughter, both stated understanding. Advised pt and daughter of s/s blocked foley cath and reasons to return contact MD or return to ED. Both state understanding.

## 2022-07-25 NOTE — Progress Notes (Addendum)
Mobility Specialist Progress Note:   07/25/22 1119  Mobility  Activity Refused mobility  Mobility Referral No   Pt refused mobility. States they are "moving just fine" and are refusing d/t fear of being charged by hospital for any mobility services. Left pt in bed, all needs met.   Feliciana Rossetti Mobility Specialist Please contact via Special educational needs teacher or  Rehab office at [phone]

## 2022-07-26 LAB — SURGICAL PATHOLOGY

## 2022-07-28 ENCOUNTER — Encounter (HOSPITAL_COMMUNITY): Payer: Self-pay | Admitting: Urology

## 2022-07-31 ENCOUNTER — Ambulatory Visit (INDEPENDENT_AMBULATORY_CARE_PROVIDER_SITE_OTHER): Payer: Medicare Other | Admitting: Urology

## 2022-07-31 VITALS — BP 130/76 | HR 64

## 2022-07-31 DIAGNOSIS — R339 Retention of urine, unspecified: Secondary | ICD-10-CM | POA: Diagnosis not present

## 2022-07-31 DIAGNOSIS — N401 Enlarged prostate with lower urinary tract symptoms: Secondary | ICD-10-CM

## 2022-07-31 DIAGNOSIS — N138 Other obstructive and reflux uropathy: Secondary | ICD-10-CM

## 2022-07-31 MED ORDER — CIPROFLOXACIN HCL 500 MG PO TABS
500.0000 mg | ORAL_TABLET | Freq: Once | ORAL | Status: AC
Start: 2022-07-31 — End: 2022-07-31
  Administered 2022-07-31: 500 mg via ORAL

## 2022-07-31 NOTE — Progress Notes (Signed)
Pt here today for bladder scan. Bladder was scanned and 109 was visualized.   Performed by Kennyth Lose, CMA  Additional follow up Keep follow up appt

## 2022-07-31 NOTE — Progress Notes (Signed)
Fill and Pull Catheter Removal  Patient is present today for a catheter removal.  Patient was cleaned and prepped in a sterile fashion of sterile water/ saline was instilled into the bladder when the patient felt the urge to urinate. 30ml of water was then drained from the balloon.  A 22FR foley cath was removed from the bladder no complications were noted .  Patient as then given some time to void on their own.  Patient can void  on their own after some time.  Patient tolerated well.  Performed by: Kennyth Lose, CMA  Follow up/ Additional notes: PVR 

## 2022-07-31 NOTE — Progress Notes (Unsigned)
07/31/2022 10:53 AM   Nena Alexander [birth year] [id number]  Referring provider: Gilmore Laroche, FNP [address] [place],  [place] [address]  Followup after TURP    HPI: Mr Krueger is a 77yo here for followup for BPH. Voiding trial passed after TURP. No hematuria. No other complaints today   PMH: Past Medical History:  Diagnosis Date   Coronary artery disease    Hypercholesteremia    Hypertension    NSTEMI (non-ST elevated myocardial infarction)     Surgical History: Past Surgical History:  Procedure Laterality Date   CERVICAL LAMINECTOMY     CYSTOSCOPY N/A 07/24/2022   Procedure: CYSTOSCOPY;  Surgeon: Malen Gauze, MD;  Location: [facility];  Service: Urology;  Laterality: N/A;   LUMBAR SPINE SURGERY     TRANSURETHRAL RESECTION OF PROSTATE N/A 07/24/2022   Procedure: TRANSURETHRAL RESECTION OF THE PROSTATE (TURP);  Surgeon: Malen Gauze, MD;  Location: [facility];  Service: Urology;  Laterality: N/A;    Home Medications:  Allergies as of 07/31/2022       Reactions   Baclofen Other (See Comments)   Bad dreams    Penicillins Other (See Comments)   "Blacked-out"        Medication List        Accurate as of July 31, 2022 10:53 AM. If you have any questions, ask your nurse or doctor.          albuterol 108 (90 Base) MCG/ACT inhaler Commonly known as: VENTOLIN HFA Inhale 2 puffs into the lungs every 6 (six) hours as needed for wheezing or shortness of breath.   aspirin EC 81 MG tablet Take 81 mg by mouth daily.   Gemtesa 75 MG Tabs Generic drug: Vibegron Take 1 tablet (75 mg total) by mouth daily.   HYDROcodone-acetaminophen 5-325 MG tablet Commonly known as: Norco Take 1 tablet by mouth every 6 (six) hours as needed for moderate pain.   metoprolol succinate 25 MG 24 hr tablet Commonly known as: TOPROL-XL Take 1 tablet (25 mg total) by mouth daily.   nitroGLYCERIN 0.4 MG SL tablet Commonly known as: NITROSTAT Place 1 tablet (0.4 mg  total) under the tongue every 5 (five) minutes as needed for chest pain.   rosuvastatin 40 MG tablet Commonly known as: CRESTOR Take 1 tablet (40 mg total) by mouth daily.        Allergies:  Allergies  Allergen Reactions   Baclofen Other (See Comments)    Bad dreams    Penicillins Other (See Comments)    "Blacked-out"    Family History: Family History  Problem Relation Age of Onset   Alzheimer's disease Mother    Aneurysm Father    COPD Sister     Social History:  reports that he has never smoked. He has never used smokeless tobacco. He reports that he does not drink alcohol and does not use drugs.  ROS: All other review of systems were reviewed and are negative except what is noted above in HPI  Physical Exam: BP 130/76   Pulse 64   Constitutional:  Alert and oriented, No acute distress. HEENT: [place] AT, moist mucus membranes.  Trachea midline, no masses. Cardiovascular: No clubbing, cyanosis, or edema. Respiratory: Normal respiratory effort, no increased work of breathing. GI: Abdomen is soft, nontender, nondistended, no abdominal masses GU: No CVA tenderness.  Lymph: No cervical or inguinal lymphadenopathy. Skin: No rashes, bruises or suspicious lesions. Neurologic: Grossly intact, no focal deficits, moving all 4 extremities.  Psychiatric: Normal mood and affect.  Laboratory Data: Lab Results  Component Value Date   WBC 16.8 (H) 07/25/2022   HGB 12.0 (L) 07/25/2022   HCT 37.5 (L) 07/25/2022   MCV 88.9 07/25/2022   PLT 161 07/25/2022    Lab Results  Component Value Date   CREATININE 1.47 (H) 07/25/2022    No results found for: "PSA"  No results found for: "TESTOSTERONE"  No results found for: "HGBA1C"  Urinalysis    Component Value Date/Time   COLORURINE AMBER (A) 03/29/2022 0025   APPEARANCEUR Clear 04/25/2022 1103   LABSPEC 1.023 03/29/2022 0025   PHURINE 5.0 03/29/2022 0025   GLUCOSEU Negative 04/25/2022 1103   HGBUR MODERATE (A) 03/29/2022  0025   BILIRUBINUR Negative 04/25/2022 1103   KETONESUR 5 (A) 03/29/2022 0025   PROTEINUR Trace 04/25/2022 1103   PROTEINUR 100 (A) 03/29/2022 0025   NITRITE Negative 04/25/2022 1103   NITRITE NEGATIVE 03/29/2022 0025   LEUKOCYTESUR 1+ (A) 04/25/2022 1103   LEUKOCYTESUR MODERATE (A) 03/29/2022 0025    Lab Results  Component Value Date   LABMICR See below: 04/25/2022   WBCUA 11-30 (A) 04/25/2022   LABEPIT 0-10 04/25/2022   BACTERIA Few 04/25/2022    Pertinent Imaging:  No results found for this or any previous visit.  No results found for this or any previous visit.  No results found for this or any previous visit.  No results found for this or any previous visit.  No results found for this or any previous visit.  No valid procedures specified. No results found for this or any previous visit.  No results found for this or any previous visit.   Assessment & Plan:    1. Urinary retention -followup 6 weeks with a PVR - Bladder Voiding Trial - ciprofloxacin (CIPRO) tablet 500 mg  2. BPH with urinary obstruction - Bladder Voiding Trial - ciprofloxacin (CIPRO) tablet 500 mg  3. Incomplete emptying of bladder - Bladder Voiding Trial - ciprofloxacin (CIPRO) tablet 500 mg   No follow-ups on file.  [name], MD  [facility] [place]

## 2022-08-01 NOTE — Discharge Summary (Signed)
Physician Discharge Summary  Patient ID: Barry Powers MRN: [id number] DOB/AGE: Apr 18, 1945 77 y.o.  Admit date: 07/24/2022 Discharge date: 07/25/2022  Admission Diagnoses:  BPH (benign prostatic hyperplasia)  Discharge Diagnoses:  Principal Problem:   BPH (benign prostatic hyperplasia) Active Problems:   Benign prostatic hyperplasia with urinary obstruction   Past Medical History:  Diagnosis Date   Coronary artery disease    Hypercholesteremia    Hypertension    NSTEMI (non-ST elevated myocardial infarction)     Surgeries: Procedure(s): CYSTOSCOPY TRANSURETHRAL RESECTION OF THE PROSTATE (TURP) on 07/24/2022   Consultants (if any):   Discharged Condition: Improved  Hospital Course: Barry Powers is an 77 y.o. male who was admitted 07/24/2022 with a diagnosis of BPH (benign prostatic hyperplasia) and went to the operating room on 07/24/2022 and underwent the above named procedures.    He was given perioperative antibiotics:  Anti-infectives (From admission, onward)    Start     Dose/Rate Route Frequency Ordered Stop   07/24/22 1328  sodium chloride 0.9 % with cefTRIAXone (ROCEPHIN) ADS Med       Note to Pharmacy: Waynard Edwards S: cabinet override      07/24/22 1328 07/24/22 1403   07/24/22 1327  cefTRIAXone (ROCEPHIN) 2 g in sodium chloride 0.9 % 100 mL IVPB        2 g 200 mL/hr over 30 Minutes Intravenous 30 min pre-op 07/24/22 1327 07/24/22 1400     .  He was given sequential compression devices, early ambulation for DVT prophylaxis.  He benefited maximally from the hospital stay and there were no complications.    Recent vital signs:  Vitals:   07/25/22 0859 07/25/22 1216  BP: (!) 132/59 (!) 127/52  Pulse: (!) 52 69  Resp:  14  Temp:  98 F (36.7 C)  SpO2:  100%    Recent laboratory studies:  Lab Results  Component Value Date   HGB 12.0 (L) 07/25/2022   HGB 13.5 07/24/2022   HGB 15.1 04/21/2022   Lab Results  Component Value Date   WBC 16.8  (H) 07/25/2022   PLT 161 07/25/2022   No results found for: "INR" Lab Results  Component Value Date   NA 136 07/25/2022   K 5.0 07/25/2022   CL 108 07/25/2022   CO2 23 07/25/2022   BUN 19 07/25/2022   CREATININE 1.47 (H) 07/25/2022   GLUCOSE 126 (H) 07/25/2022    Discharge Medications:   Allergies as of 07/25/2022       Reactions   Baclofen Other (See Comments)   Bad dreams    Penicillins Other (See Comments)   "Blacked-out"        Medication List     STOP taking these medications    Gemtesa 75 MG Tabs Generic drug: Vibegron   solifenacin 10 MG tablet Commonly known as: VESICARE   tamsulosin 0.4 MG Caps capsule Commonly known as: FLOMAX       TAKE these medications    albuterol 108 (90 Base) MCG/ACT inhaler Commonly known as: VENTOLIN HFA Inhale 2 puffs into the lungs every 6 (six) hours as needed for wheezing or shortness of breath.   aspirin EC 81 MG tablet Take 81 mg by mouth daily.   HYDROcodone-acetaminophen 5-325 MG tablet Commonly known as: Norco Take 1 tablet by mouth every 6 (six) hours as needed for moderate pain.   metoprolol succinate 25 MG 24 hr tablet Commonly known as: TOPROL-XL Take 1 tablet (25 mg total) by mouth daily.  nitroGLYCERIN 0.4 MG SL tablet Commonly known as: NITROSTAT Place 1 tablet (0.4 mg total) under the tongue every 5 (five) minutes as needed for chest pain.   rosuvastatin 40 MG tablet Commonly known as: CRESTOR Take 1 tablet (40 mg total) by mouth daily.        Diagnostic Studies: No results found.  Disposition: Discharge disposition: 01-Home or Self Care       Discharge Instructions     Discharge patient   Complete by: As directed    Discharge disposition: 01-Home or Self Care   Discharge patient date: 07/25/2022        Follow-up Information     Mary-Ann Pennella, Mardene Celeste, MD. Call on 07/31/2022.   Specialty: Urology Contact information: [address]  [place]  [address] [phone]                  Signed: [name] 08/01/2022, 8:35 AM

## 2022-08-31 ENCOUNTER — Telehealth: Payer: Self-pay

## 2022-08-31 NOTE — Telephone Encounter (Signed)
Patients wife called advising after patients recent TURPT procedure he has no control of his bladder. She wanted to know since your next available appointment I 1 month out if you could call her to discuss these issues. His wife advised that she will be at an appt tomorrow but  should be home around 12.    Thank you

## 2022-09-06 MED ORDER — MIRABEGRON ER 25 MG PO TB24: 25.0000 mg | ORAL_TABLET | Freq: Every day | ORAL | 0 refills | Status: DC

## 2022-09-06 NOTE — Telephone Encounter (Signed)
Rx at front desk

## 2022-09-11 ENCOUNTER — Ambulatory Visit: Payer: Medicare Other | Admitting: Family Medicine

## 2022-09-27 ENCOUNTER — Ambulatory Visit: Payer: Medicare Other | Admitting: Urology

## 2022-10-02 ENCOUNTER — Ambulatory Visit: Payer: Medicare Other | Admitting: Urology

## 2022-10-10 ENCOUNTER — Encounter (HOSPITAL_COMMUNITY): Payer: Self-pay | Admitting: Radiology

## 2022-10-10 ENCOUNTER — Emergency Department (HOSPITAL_COMMUNITY): Payer: Medicare Other

## 2022-10-10 ENCOUNTER — Telehealth: Payer: Self-pay

## 2022-10-10 ENCOUNTER — Inpatient Hospital Stay (HOSPITAL_COMMUNITY)
Admission: EM | Admit: 2022-10-10 | Discharge: 2022-10-13 | DRG: 872 | Disposition: A | Discharge status: Home / Self Care | Payer: Medicare Other | Attending: Internal Medicine | Admitting: Internal Medicine

## 2022-10-10 ENCOUNTER — Other Ambulatory Visit: Payer: Self-pay

## 2022-10-10 DIAGNOSIS — N1831 Chronic kidney disease, stage 3a: Secondary | ICD-10-CM | POA: Diagnosis present

## 2022-10-10 DIAGNOSIS — N5082 Scrotal pain: Secondary | ICD-10-CM | POA: Diagnosis not present

## 2022-10-10 DIAGNOSIS — N433 Hydrocele, unspecified: Secondary | ICD-10-CM | POA: Diagnosis present

## 2022-10-10 DIAGNOSIS — I252 Old myocardial infarction: Secondary | ICD-10-CM

## 2022-10-10 DIAGNOSIS — R531 Weakness: Secondary | ICD-10-CM | POA: Diagnosis not present

## 2022-10-10 DIAGNOSIS — K4090 Unilateral inguinal hernia, without obstruction or gangrene, not specified as recurrent: Secondary | ICD-10-CM | POA: Diagnosis not present

## 2022-10-10 DIAGNOSIS — E872 Acidosis, unspecified: Secondary | ICD-10-CM | POA: Diagnosis present

## 2022-10-10 DIAGNOSIS — N401 Enlarged prostate with lower urinary tract symptoms: Secondary | ICD-10-CM | POA: Diagnosis present

## 2022-10-10 DIAGNOSIS — K8020 Calculus of gallbladder without cholecystitis without obstruction: Secondary | ICD-10-CM | POA: Diagnosis present

## 2022-10-10 DIAGNOSIS — Z880 Allergy status to penicillin: Secondary | ICD-10-CM | POA: Diagnosis not present

## 2022-10-10 DIAGNOSIS — N179 Acute kidney failure, unspecified: Secondary | ICD-10-CM

## 2022-10-10 DIAGNOSIS — N390 Urinary tract infection, site not specified: Secondary | ICD-10-CM | POA: Diagnosis present

## 2022-10-10 DIAGNOSIS — I129 Hypertensive chronic kidney disease with stage 1 through stage 4 chronic kidney disease, or unspecified chronic kidney disease: Secondary | ICD-10-CM | POA: Diagnosis present

## 2022-10-10 DIAGNOSIS — Z825 Family history of asthma and other chronic lower respiratory diseases: Secondary | ICD-10-CM

## 2022-10-10 DIAGNOSIS — E7800 Pure hypercholesterolemia, unspecified: Secondary | ICD-10-CM | POA: Diagnosis present

## 2022-10-10 DIAGNOSIS — Z9079 Acquired absence of other genital organ(s): Secondary | ICD-10-CM

## 2022-10-10 DIAGNOSIS — A419 Sepsis, unspecified organism: Secondary | ICD-10-CM | POA: Diagnosis not present

## 2022-10-10 DIAGNOSIS — I10 Essential (primary) hypertension: Secondary | ICD-10-CM | POA: Diagnosis present

## 2022-10-10 DIAGNOSIS — N453 Epididymo-orchitis: Secondary | ICD-10-CM | POA: Diagnosis not present

## 2022-10-10 DIAGNOSIS — K828 Other specified diseases of gallbladder: Secondary | ICD-10-CM | POA: Diagnosis not present

## 2022-10-10 DIAGNOSIS — I700 Atherosclerosis of aorta: Secondary | ICD-10-CM | POA: Diagnosis not present

## 2022-10-10 DIAGNOSIS — I2510 Atherosclerotic heart disease of native coronary artery without angina pectoris: Secondary | ICD-10-CM | POA: Diagnosis not present

## 2022-10-10 DIAGNOSIS — R1084 Generalized abdominal pain: Secondary | ICD-10-CM | POA: Diagnosis not present

## 2022-10-10 DIAGNOSIS — Z79899 Other long term (current) drug therapy: Secondary | ICD-10-CM

## 2022-10-10 DIAGNOSIS — I444 Left anterior fascicular block: Secondary | ICD-10-CM | POA: Diagnosis present

## 2022-10-10 DIAGNOSIS — A4181 Sepsis due to Enterococcus: Principal | ICD-10-CM | POA: Diagnosis present

## 2022-10-10 DIAGNOSIS — N451 Epididymitis: Secondary | ICD-10-CM | POA: Diagnosis not present

## 2022-10-10 DIAGNOSIS — Z820 Family history of epilepsy and other diseases of the nervous system: Secondary | ICD-10-CM

## 2022-10-10 DIAGNOSIS — Z7982 Long term (current) use of aspirin: Secondary | ICD-10-CM

## 2022-10-10 DIAGNOSIS — N452 Orchitis: Secondary | ICD-10-CM | POA: Diagnosis not present

## 2022-10-10 DIAGNOSIS — R000 Tachycardia, unspecified: Secondary | ICD-10-CM | POA: Diagnosis not present

## 2022-10-10 DIAGNOSIS — Z881 Allergy status to other antibiotic agents status: Secondary | ICD-10-CM | POA: Diagnosis not present

## 2022-10-10 DIAGNOSIS — R339 Retention of urine, unspecified: Secondary | ICD-10-CM

## 2022-10-10 DIAGNOSIS — R6520 Severe sepsis without septic shock: Secondary | ICD-10-CM | POA: Diagnosis present

## 2022-10-10 DIAGNOSIS — N400 Enlarged prostate without lower urinary tract symptoms: Secondary | ICD-10-CM | POA: Diagnosis not present

## 2022-10-10 DIAGNOSIS — N138 Other obstructive and reflux uropathy: Secondary | ICD-10-CM | POA: Diagnosis present

## 2022-10-10 DIAGNOSIS — R609 Edema, unspecified: Secondary | ICD-10-CM | POA: Diagnosis not present

## 2022-10-10 LAB — COMPREHENSIVE METABOLIC PANEL
ALT: 17 U/L (ref 0–44)
AST: 16 U/L (ref 15–41)
Albumin: 3.5 g/dL (ref 3.5–5.0)
Alkaline Phosphatase: 43 U/L (ref 38–126)
Anion gap: 10 (ref 5–15)
BUN: 18 mg/dL (ref 8–23)
CO2: 24 mmol/L (ref 22–32)
Calcium: 8.8 mg/dL — ABNORMAL LOW (ref 8.9–10.3)
Chloride: 102 mmol/L (ref 98–111)
Creatinine, Ser: 1.72 mg/dL — ABNORMAL HIGH (ref 0.61–1.24)
GFR, Estimated: 41 mL/min — ABNORMAL LOW (ref >60)
Glucose, Bld: 120 mg/dL — ABNORMAL HIGH (ref 70–99)
Potassium: 3.9 mmol/L (ref 3.5–5.1)
Sodium: 136 mmol/L (ref 135–145)
Total Bilirubin: 1.3 mg/dL — ABNORMAL HIGH (ref 0.3–1.2)
Total Protein: 6.9 g/dL (ref 6.5–8.1)

## 2022-10-10 LAB — CBC WITH DIFFERENTIAL/PLATELET
Abs Immature Granulocytes: 0.34 10*3/uL — ABNORMAL HIGH (ref 0.00–0.07)
Basophils Absolute: 0.1 10*3/uL (ref 0.0–0.1)
Basophils Relative: 1 %
Eosinophils Absolute: 0 10*3/uL (ref 0.0–0.5)
Eosinophils Relative: 0 %
HCT: 42.7 % (ref 39.0–52.0)
Hemoglobin: 14.3 g/dL (ref 13.0–17.0)
Immature Granulocytes: 1 %
Lymphocytes Relative: 10 %
Lymphs Abs: 2.4 10*3/uL (ref 0.7–4.0)
MCH: 28.6 pg (ref 26.0–34.0)
MCHC: 33.5 g/dL (ref 30.0–36.0)
MCV: 85.4 fL (ref 80.0–100.0)
Monocytes Absolute: 2.1 10*3/uL — ABNORMAL HIGH (ref 0.1–1.0)
Monocytes Relative: 9 %
Neutro Abs: 19.4 10*3/uL — ABNORMAL HIGH (ref 1.7–7.7)
Neutrophils Relative %: 79 %
Platelets: 143 10*3/uL — ABNORMAL LOW (ref 150–400)
RBC: 5 MIL/uL (ref 4.22–5.81)
RDW: 15.8 % — ABNORMAL HIGH (ref 11.5–15.5)
WBC: 24.3 10*3/uL — ABNORMAL HIGH (ref 4.0–10.5)
nRBC: 0 % (ref 0.0–0.2)

## 2022-10-10 LAB — LACTIC ACID, PLASMA
Lactic Acid, Venous: 1.8 mmol/L (ref 0.5–1.9)
Lactic Acid, Venous: 2.1 mmol/L (ref 0.5–1.9)

## 2022-10-10 LAB — URINALYSIS, ROUTINE W REFLEX MICROSCOPIC
Bilirubin Urine: NEGATIVE
Glucose, UA: NEGATIVE mg/dL
Ketones, ur: 5 mg/dL — AB
Nitrite: NEGATIVE
Protein, ur: 100 mg/dL — AB
Specific Gravity, Urine: 1.029 (ref 1.005–1.030)
WBC, UA: >50 WBC/hpf (ref 0–5)
pH: 5 (ref 5.0–8.0)

## 2022-10-10 LAB — PROTIME-INR
INR: 1.2 (ref 0.8–1.2)
Prothrombin Time: 14.9 seconds (ref 11.4–15.2)

## 2022-10-10 LAB — APTT: aPTT: 31 seconds (ref 24–36)

## 2022-10-10 MED ORDER — SODIUM CHLORIDE 0.9 % IV SOLN
1.0000 g | Freq: Once | INTRAVENOUS | Status: AC
  Administered 2022-10-10: 1 g via INTRAVENOUS
  Filled 2022-10-10: qty 10

## 2022-10-10 MED ORDER — ROSUVASTATIN CALCIUM 20 MG PO TABS
40.0000 mg | ORAL_TABLET | Freq: Every day | ORAL | Status: DC
  Administered 2022-10-10 – 2022-10-13 (×4): 40 mg via ORAL
  Filled 2022-10-10 (×4): qty 2

## 2022-10-10 MED ORDER — MIRABEGRON ER 25 MG PO TB24
25.0000 mg | ORAL_TABLET | Freq: Every day | ORAL | 0 refills | Status: DC
Start: 2022-10-10 — End: 2022-11-03

## 2022-10-10 MED ORDER — IOHEXOL 300 MG/ML  SOLN
100.0000 mL | Freq: Once | INTRAMUSCULAR | Status: AC | PRN
  Administered 2022-10-10: 100 mL via INTRAVENOUS

## 2022-10-10 MED ORDER — SODIUM CHLORIDE 0.9 % IV SOLN: 2.0000 g | Freq: Once | INTRAVENOUS | Status: DC

## 2022-10-10 MED ORDER — METOPROLOL SUCCINATE ER 25 MG PO TB24
25.0000 mg | ORAL_TABLET | Freq: Every day | ORAL | Status: DC
  Administered 2022-10-10 – 2022-10-13 (×4): 25 mg via ORAL
  Filled 2022-10-10 (×4): qty 1

## 2022-10-10 MED ORDER — ONDANSETRON HCL 4 MG/2ML IJ SOLN
4.0000 mg | Freq: Four times a day (QID) | INTRAMUSCULAR | Status: DC | PRN
  Administered 2022-10-10 – 2022-10-11 (×2): 4 mg via INTRAVENOUS
  Filled 2022-10-10 (×2): qty 2

## 2022-10-10 MED ORDER — ONDANSETRON HCL 4 MG/2ML IJ SOLN
4.0000 mg | Freq: Once | INTRAMUSCULAR | Status: AC
  Administered 2022-10-10: 4 mg via INTRAVENOUS
  Filled 2022-10-10: qty 2

## 2022-10-10 MED ORDER — MORPHINE SULFATE (PF) 4 MG/ML IV SOLN
4.0000 mg | Freq: Once | INTRAVENOUS | Status: AC
  Administered 2022-10-10: 4 mg via INTRAVENOUS
  Filled 2022-10-10: qty 1

## 2022-10-10 MED ORDER — HEPARIN SODIUM (PORCINE) 5000 UNIT/ML IJ SOLN
5000.0000 [IU] | Freq: Three times a day (TID) | INTRAMUSCULAR | Status: DC
  Administered 2022-10-10 – 2022-10-13 (×8): 5000 [IU] via SUBCUTANEOUS
  Filled 2022-10-10 (×8): qty 1

## 2022-10-10 MED ORDER — SODIUM CHLORIDE 0.9 % IV SOLN
2.0000 g | Freq: Two times a day (BID) | INTRAVENOUS | Status: DC
  Administered 2022-10-10: 2 g via INTRAVENOUS
  Filled 2022-10-10: qty 12.5

## 2022-10-10 MED ORDER — SODIUM CHLORIDE 0.9% FLUSH
3.0000 mL | Freq: Two times a day (BID) | INTRAVENOUS | Status: DC
  Administered 2022-10-10 – 2022-10-13 (×6): 3 mL via INTRAVENOUS

## 2022-10-10 MED ORDER — SODIUM CHLORIDE 0.9 % IV BOLUS
1000.0000 mL | Freq: Once | INTRAVENOUS | Status: AC
  Administered 2022-10-10: 1000 mL via INTRAVENOUS

## 2022-10-10 MED ORDER — BISACODYL 10 MG RE SUPP: 10.0000 mg | Freq: Every day | RECTAL | Status: DC | PRN

## 2022-10-10 MED ORDER — LEVOFLOXACIN IN D5W 500 MG/100ML IV SOLN
500.0000 mg | INTRAVENOUS | Status: DC
  Administered 2022-10-10 – 2022-10-12 (×3): 500 mg via INTRAVENOUS
  Filled 2022-10-10 (×3): qty 100

## 2022-10-10 MED ORDER — ONDANSETRON HCL 4 MG PO TABS: 4.0000 mg | ORAL_TABLET | Freq: Four times a day (QID) | ORAL | Status: DC | PRN

## 2022-10-10 MED ORDER — ACETAMINOPHEN 325 MG PO TABS
650.0000 mg | ORAL_TABLET | Freq: Four times a day (QID) | ORAL | Status: DC | PRN
  Filled 2022-10-10: qty 2

## 2022-10-10 MED ORDER — TRAZODONE HCL 50 MG PO TABS: 50.0000 mg | ORAL_TABLET | Freq: Every evening | ORAL | Status: DC | PRN

## 2022-10-10 MED ORDER — SODIUM CHLORIDE 0.9% FLUSH: 3.0000 mL | INTRAVENOUS | Status: DC | PRN

## 2022-10-10 MED ORDER — SODIUM CHLORIDE 0.9% FLUSH
3.0000 mL | Freq: Two times a day (BID) | INTRAVENOUS | Status: DC
  Administered 2022-10-10 – 2022-10-13 (×4): 3 mL via INTRAVENOUS

## 2022-10-10 MED ORDER — POLYETHYLENE GLYCOL 3350 17 G PO PACK: 17.0000 g | PACK | Freq: Every day | ORAL | Status: DC | PRN

## 2022-10-10 MED ORDER — LACTATED RINGERS IV BOLUS
500.0000 mL | Freq: Once | INTRAVENOUS | Status: AC
  Administered 2022-10-10: 500 mL via INTRAVENOUS

## 2022-10-10 MED ORDER — SODIUM CHLORIDE 0.9 % IV SOLN: INTRAVENOUS | Status: AC

## 2022-10-10 MED ORDER — TAMSULOSIN HCL 0.4 MG PO CAPS
0.4000 mg | ORAL_CAPSULE | Freq: Every day | ORAL | Status: DC
  Administered 2022-10-10 – 2022-10-12 (×3): 0.4 mg via ORAL
  Filled 2022-10-10 (×3): qty 1

## 2022-10-10 MED ORDER — MIRABEGRON ER 25 MG PO TB24
25.0000 mg | ORAL_TABLET | Freq: Every day | ORAL | Status: DC
  Administered 2022-10-10 – 2022-10-13 (×4): 25 mg via ORAL
  Filled 2022-10-10 (×4): qty 1

## 2022-10-10 MED ORDER — SODIUM CHLORIDE 0.9 % IV SOLN: INTRAVENOUS | Status: DC | PRN

## 2022-10-10 MED ORDER — ACETAMINOPHEN 650 MG RE SUPP: 650.0000 mg | Freq: Four times a day (QID) | RECTAL | Status: DC | PRN

## 2022-10-10 MED ORDER — ASPIRIN 81 MG PO TBEC
81.0000 mg | DELAYED_RELEASE_TABLET | Freq: Every day | ORAL | Status: DC
  Administered 2022-10-10 – 2022-10-13 (×4): 81 mg via ORAL
  Filled 2022-10-10 (×4): qty 1

## 2022-10-10 MED ORDER — ALBUTEROL SULFATE (2.5 MG/3ML) 0.083% IN NEBU: 2.5000 mg | INHALATION_SOLUTION | RESPIRATORY_TRACT | Status: DC | PRN

## 2022-10-10 MED ORDER — VANCOMYCIN HCL IN DEXTROSE 1-5 GM/200ML-% IV SOLN
1000.0000 mg | Freq: Once | INTRAVENOUS | Status: AC
  Administered 2022-10-10: 1000 mg via INTRAVENOUS
  Filled 2022-10-10: qty 200

## 2022-10-10 NOTE — H&P (Signed)
Patient Demographics:    Barry Powers, is a 77 y.o. male  MRN: [id number]   DOB - [birth year]  Admit Date - 10/10/2022  Outpatient Primary MD for the patient is Gilmore Laroche, FNP   Assessment & Plan:   Assessment and Plan:  1) epididymo-orchitis on the right--- scrotal ultrasound findings noted -Patient states he is not sexually active -Give Rocephin 2 g x 1 -Pharmacy consult for Levaquin dosing -urology consult requested  2) gallstones with emesis x 2 --- CT finding of gallstones with distended gallbladder--- no significant right upper quadrant tenderness, negative Murphy sign -Right upper quadrant ultrasound requested  3) possible UTI--antibiotics as above #1 pending culture data  4) sepsis---POA---secondary to above -Patient with fevers/chills prior to admission -Tachypnea, tachycardia and leukocytosis -Mild lactic acidosis noted -IV fluids as ordered -Antibiotics as above 1 pending culture data  5)AKI----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 Barriers: Not Clinically Stable-   With History of - Reviewed by me  Past Medical History:  Diagnosis Date   Coronary artery disease    Hypercholesteremia    Hypertension    NSTEMI (non-ST elevated myocardial infarction) [facility])       Past Surgical History:  Procedure Laterality Date   CERVICAL LAMINECTOMY     CYSTOSCOPY N/A  07/24/2022   Procedure: CYSTOSCOPY;  Surgeon: Malen Gauze, MD;  Location: [facility];  Service: Urology;  Laterality: N/A;   LUMBAR SPINE SURGERY     TRANSURETHRAL RESECTION OF PROSTATE N/A 07/24/2022   Procedure: TRANSURETHRAL RESECTION OF THE PROSTATE (TURP);  Surgeon: Malen Gauze, MD;  Location: [facility];  Service: Urology;  Laterality: N/A;   Chief Complaint  Patient presents with   Weakness      HPI:    [name]  is a 77 y.o. male with past medical history relevant for BPH, CAD, HTN and HLD presents to ED with generalized weakness, chills, fevers and right-sided testicular pain and swelling with redness =-Patient stated that the scrotal swelling present for few days.  Fevers chills and weakness started over the last 24 hours -Patient states he was dark and foul-smelling, slight urinary discomfort but no hematuria -While here in the hospital patient had episode of emesis x2 -Emesis without bile or blood  -Scrotal ultrasound shows:-  IMPRESSION: 1. Increased vascularity to the RIGHT epididymis and RIGHT testicle likely representing  RIGHT epididymo-orchitis. 2. Small to moderate RIGHT hydrocele and small LEFT hydrocele. 3. No evidence of testicular torsion or mass.  -CT abdomen and pelvis--gallstones and mildly distended gallbladder--right upper quadrant ultrasound requested  Chest x-ray without acute findings - UA suggestive of UTI  WBCs 24.3 -Hemoglobin 14.3, platelets 143 Lactic acid was 1.8 >> 2.1 -Creatinine is up to 1.72, baseline 1.4   Review of systems:    In addition to the HPI above,   A full Review of  Systems was done, all other systems reviewed are negative except as noted above in HPI , .    Social History:  Reviewed by me    Social History   Tobacco Use   Smoking status: Never   Smokeless tobacco: Never  Substance Use Topics   Alcohol use: No    Family History :  Reviewed by me   Family History  Problem Relation Age of Onset    Alzheimer's disease Mother    Aneurysm Father    COPD Sister     Home Medications:   Prior to Admission medications   Medication Sig Start Date End Date Taking? Authorizing Provider  albuterol (VENTOLIN HFA) 108 (90 Base) MCG/ACT inhaler Inhale 2 puffs into the lungs every 6 (six) hours as needed for wheezing or shortness of breath. 03/31/22  Yes Erick Blinks, MD  aspirin EC 81 MG tablet Take 81 mg by mouth daily.   Yes [provider]  metoprolol succinate (TOPROL-XL) 25 MG 24 hr tablet Take 1 tablet (25 mg total) by mouth daily. 04/11/22  Yes Mallipeddi, Vishnu P, MD  mirabegron ER (MYRBETRIQ) 25 MG TB24 tablet Take 1 tablet (25 mg total) by mouth daily. 10/10/22  Yes McKenzie, Mardene Celeste, MD  rosuvastatin (CRESTOR) 40 MG tablet Take 1 tablet (40 mg total) by mouth daily. 01/31/22 01/26/23 Yes Mallipeddi, Vishnu P, MD  HYDROcodone-acetaminophen (NORCO) 5-325 MG tablet Take 1 tablet by mouth every 6 (six) hours as needed for moderate pain. Patient not taking: Reported on 10/10/2022 07/25/22   Malen Gauze, MD  nitroGLYCERIN (NITROSTAT) 0.4 MG SL tablet Place 1 tablet (0.4 mg total) under the tongue every 5 (five) minutes as needed for chest pain. 01/31/22   Wendall Stade, MD     Allergies:     Allergies  Allergen Reactions   Baclofen Other (See Comments)    Bad dreams    Penicillins Other (See Comments)    "Blacked-out"     Physical Exam:   Vitals  Blood pressure 121/68, pulse 68, temperature 98.8 F (37.1 C), temperature source Oral, resp. rate 20, height 5\' 9"  (1.753 m), weight 77.2 kg, SpO2 94 %.  Physical Examination: General appearance - alert,  in no distress  Mental status - alert, oriented to person, place, and time,  Eyes - sclera anicteric Neck - supple, no JVD elevation , Chest - clear  to auscultation bilaterally, symmetrical air movement,  Heart - S1 and S2 normal, regular  Abdomen - soft, nontender, nondistended, +BS Neurological - screening  mental status exam normal, neck supple without rigidity, cranial nerves II through XII intact, DTR's normal and symmetric Extremities - no pedal edema noted, intact peripheral pulses  Skin - warm, dry GU-right scrotum/testes/epididymis tender to palpation, warm and swollen..... Scrotal erythema noted, no open wounds or drainage Media Information  Document Information  Photos  Rt scrotal swelling/redness  10/10/2022 17:38  Attached To:  Hospital Encounter on 10/10/22  Source Information  Shon Hale, MD  [facility]  Data Review:    CBC Recent Labs  Lab 10/10/22 1012  WBC 24.3*  HGB 14.3  HCT 42.7  PLT 143*  MCV 85.4  MCH 28.6  MCHC 33.5  RDW 15.8*  LYMPHSABS 2.4  MONOABS 2.1*  EOSABS 0.0  BASOSABS 0.1   ------------------------------------------------------------------------------------------------------------------  Chemistries  Recent Labs  Lab 10/10/22 1012  NA 136  K 3.9  CL 102  CO2 24  GLUCOSE 120*  BUN 18  CREATININE 1.72*  CALCIUM 8.8*  AST 16  ALT 17  ALKPHOS 43  BILITOT 1.3*   ------------------------------------------------------------------------------------------------------------------ estimated creatinine clearance is 36.5 mL/min (A) (by C-G formula based on SCr of 1.72 mg/dL (H)). ------------------------------------------------------------------------------------------------------------------ Coagulation profile Recent Labs  Lab 10/10/22 1012  INR 1.2   ------------------------------------------------------------------------------------------------------------------    Component Value Date/Time   BNP 112.0 (H) 03/28/2022 2015   Urinalysis    Component Value Date/Time   COLORURINE AMBER (A) 10/10/2022 1131   APPEARANCEUR TURBID (A) 10/10/2022 1131   APPEARANCEUR Clear 04/25/2022 1103   LABSPEC 1.029 10/10/2022 1131   PHURINE 5.0 10/10/2022 1131   GLUCOSEU NEGATIVE 10/10/2022 1131   HGBUR MODERATE (A) 10/10/2022  1131   BILIRUBINUR NEGATIVE 10/10/2022 1131   BILIRUBINUR Negative 04/25/2022 1103   KETONESUR 5 (A) 10/10/2022 1131   PROTEINUR 100 (A) 10/10/2022 1131   NITRITE NEGATIVE 10/10/2022 1131   LEUKOCYTESUR MODERATE (A) 10/10/2022 1131    ----------------------------------------------------------------------------------------------------------------   Imaging Results:    US SCROTUM W/DOPPLER  Result Date: 10/10/2022 CLINICAL DATA:  77 year old male with RIGHT scrotal pain and swelling. EXAM: SCROTAL ULTRASOUND DOPPLER ULTRASOUND OF THE TESTICLES TECHNIQUE: Complete ultrasound examination of the testicles, epididymis, and other scrotal structures was performed. Color and spectral Doppler ultrasound were also utilized to evaluate blood flow to the testicles. COMPARISON:  None Available. FINDINGS: Right testicle Measurements: 4.6 x 3.3 x 3.6 cm. No mass or microlithiasis visualized. Increased vascularity to the RIGHT testicle is noted. Left testicle Measurements: 4.5 x 2.8 x 2.5 cm. No mass or microlithiasis visualized. Right epididymis: UPPER limits of normal RIGHT epididymis size noted with increased vascularity. Left epididymis:  Normal in size and appearance. Hydrocele: A small to moderate RIGHT hydrocele and small LEFT hydrocele are noted. Varicocele:  None visualized. Pulsed Doppler interrogation of both testes demonstrates normal low resistance arterial and venous waveforms bilaterally. IMPRESSION: 1. Increased vascularity to the RIGHT epididymis and RIGHT testicle likely representing RIGHT epididymo-orchitis. 2. Small to moderate RIGHT hydrocele and small LEFT hydrocele. 3. No evidence of testicular torsion or mass. Electronically Signed   By: Harmon Pier M.D.   On: 10/10/2022 13:57   CT ABDOMEN PELVIS W CONTRAST  Result Date: 10/10/2022 CLINICAL DATA:  Abdominal pain.  Sepsis.  Swelling EXAM: CT ABDOMEN AND PELVIS WITH CONTRAST TECHNIQUE: Multidetector CT imaging of the abdomen and pelvis was  performed using the standard protocol following bolus administration of intravenous contrast. RADIATION DOSE REDUCTION: This exam was performed according to the departmental dose-optimization program which includes automated exposure control, adjustment of the mA and/or kV according to patient size and/or use of iterative reconstruction technique. CONTRAST:  OMNIPAQUE IOHEXOL 300 MG/ML  SOLN COMPARISON:  None Available. FINDINGS: Lower chest: There is some linear opacity lung bases likely scar or atelectasis. No pleural effusion. There is breathing motion at the lung bases. Hepatobiliary: Patent portal vein. Gallbladder has some dependent stones. Gallbladder is mildly distended. There are some tiny low-attenuation lesion seen in the liver which are too small to completely characterize such as series 2,  image 18. Favor a benign cystic lesion statistically. No specific imaging follow-up. Separate hepatic granuloma on series 2, image 15. Pancreas: Unremarkable. No pancreatic ductal dilatation or surrounding inflammatory changes. Spleen: Small cystic area along the posteroinferior aspect of the spleen. Too small to completely characterize. Again likely a benign cystic lesion. Adrenals/Urinary Tract: The adrenal glands are preserved. Parapelvic lower pole left-sided renal cysts. No enhancing renal mass. No collecting system dilatation. The ureters have normal course and caliber extending down to the bladder. Bladder wall is thickened and trabeculated with some left-sided bladder diverticula. Heterogeneous enlarged prostate with mass effect along the base of the bladder. Possible TURP defect. Please correlate with history. Stomach/Bowel: No oral contrast. Stomach and small bowel are nondilated. Large bowel has a normal course and caliber with scattered stool. Slightly redundant course of the sigmoid colon. Normal appendix. Vascular/Lymphatic: Aortic atherosclerosis. No enlarged abdominal or pelvic lymph nodes.  Reproductive: Heterogeneous prostate with mass effect along the base of the bladder. Possible TURP defect. Please correlate with the history. Other: Small fat containing right inguinal hernia. There is some fluid along the right inguinal canal at the edge of the imaging field. Recommend follow up scrotal ultrasound when appropriate if there is further concern. Musculoskeletal: Mild curvature of the spine. Scattered degenerative changes of the spine and pelvis. Transitional lumbosacral segment. IMPRESSION: No bowel obstruction, free air or free fluid.  Normal appendix. Gallstones in the mildly distended gallbladder. If there is further concern of acute gallbladder pathology, ultrasound may be useful when clinically appropriate. Trabeculated urinary bladder wall thickening with some stranding and diverticula formation. Please correlate for any history of cystitis. There is also an enlarged prostate with mass effect along the bladder but possible TURP defect. Please correlate with history. Small right inguinal fat containing hernia with some fluid in the inguinal canal more caudal at the edge of the imaging field. If there is further concern of the inguinal canal in the scrotum, ultrasound may be useful when clinically appropriate Electronically Signed   By: Karen Kays M.D.   On: 10/10/2022 12:11   DG Chest Port 1 View  Result Date: 10/10/2022 CLINICAL DATA:  Provided history: Questionable sepsis-evaluate for abnormality. EXAM: PORTABLE CHEST 1 VIEW COMPARISON:  Chest CT 03/28/2022. Prior chest radiographs 03/28/2022 and earlier FINDINGS: Heart size within normal limits. Aortic atherosclerosis. No appreciable airspace consolidation. No evidence of pleural effusion or pneumothorax. No acute osseous abnormality identified. IMPRESSION: 1. No evidence of an acute cardiopulmonary abnormality. 2. Aortic Atherosclerosis (ICD10-I70.0). Electronically Signed   By: Jackey Loge D.O.   On: 10/10/2022 10:34    Radiological  Exams on Admission: US SCROTUM W/DOPPLER  Result Date: 10/10/2022 CLINICAL DATA:  77 year old male with RIGHT scrotal pain and swelling. EXAM: SCROTAL ULTRASOUND DOPPLER ULTRASOUND OF THE TESTICLES TECHNIQUE: Complete ultrasound examination of the testicles, epididymis, and other scrotal structures was performed. Color and spectral Doppler ultrasound were also utilized to evaluate blood flow to the testicles. COMPARISON:  None Available. FINDINGS: Right testicle Measurements: 4.6 x 3.3 x 3.6 cm. No mass or microlithiasis visualized. Increased vascularity to the RIGHT testicle is noted. Left testicle Measurements: 4.5 x 2.8 x 2.5 cm. No mass or microlithiasis visualized. Right epididymis: UPPER limits of normal RIGHT epididymis size noted with increased vascularity. Left epididymis:  Normal in size and appearance. Hydrocele: A small to moderate RIGHT hydrocele and small LEFT hydrocele are noted. Varicocele:  None visualized. Pulsed Doppler interrogation of both testes demonstrates normal low resistance arterial and venous waveforms bilaterally. IMPRESSION:  1. Increased vascularity to the RIGHT epididymis and RIGHT testicle likely representing RIGHT epididymo-orchitis. 2. Small to moderate RIGHT hydrocele and small LEFT hydrocele. 3. No evidence of testicular torsion or mass. Electronically Signed   By: Harmon Pier M.D.   On: 10/10/2022 13:57   CT ABDOMEN PELVIS W CONTRAST  Result Date: 10/10/2022 CLINICAL DATA:  Abdominal pain.  Sepsis.  Swelling EXAM: CT ABDOMEN AND PELVIS WITH CONTRAST TECHNIQUE: Multidetector CT imaging of the abdomen and pelvis was performed using the standard protocol following bolus administration of intravenous contrast. RADIATION DOSE REDUCTION: This exam was performed according to the departmental dose-optimization program which includes automated exposure control, adjustment of the mA and/or kV according to patient size and/or use of iterative reconstruction technique. CONTRAST:   OMNIPAQUE IOHEXOL 300 MG/ML  SOLN COMPARISON:  None Available. FINDINGS: Lower chest: There is some linear opacity lung bases likely scar or atelectasis. No pleural effusion. There is breathing motion at the lung bases. Hepatobiliary: Patent portal vein. Gallbladder has some dependent stones. Gallbladder is mildly distended. There are some tiny low-attenuation lesion seen in the liver which are too small to completely characterize such as series 2, image 18. Favor a benign cystic lesion statistically. No specific imaging follow-up. Separate hepatic granuloma on series 2, image 15. Pancreas: Unremarkable. No pancreatic ductal dilatation or surrounding inflammatory changes. Spleen: Small cystic area along the posteroinferior aspect of the spleen. Too small to completely characterize. Again likely a benign cystic lesion. Adrenals/Urinary Tract: The adrenal glands are preserved. Parapelvic lower pole left-sided renal cysts. No enhancing renal mass. No collecting system dilatation. The ureters have normal course and caliber extending down to the bladder. Bladder wall is thickened and trabeculated with some left-sided bladder diverticula. Heterogeneous enlarged prostate with mass effect along the base of the bladder. Possible TURP defect. Please correlate with history. Stomach/Bowel: No oral contrast. Stomach and small bowel are nondilated. Large bowel has a normal course and caliber with scattered stool. Slightly redundant course of the sigmoid colon. Normal appendix. Vascular/Lymphatic: Aortic atherosclerosis. No enlarged abdominal or pelvic lymph nodes. Reproductive: Heterogeneous prostate with mass effect along the base of the bladder. Possible TURP defect. Please correlate with the history. Other: Small fat containing right inguinal hernia. There is some fluid along the right inguinal canal at the edge of the imaging field. Recommend follow up scrotal ultrasound when appropriate if there is further concern.  Musculoskeletal: Mild curvature of the spine. Scattered degenerative changes of the spine and pelvis. Transitional lumbosacral segment. IMPRESSION: No bowel obstruction, free air or free fluid.  Normal appendix. Gallstones in the mildly distended gallbladder. If there is further concern of acute gallbladder pathology, ultrasound may be useful when clinically appropriate. Trabeculated urinary bladder wall thickening with some stranding and diverticula formation. Please correlate for any history of cystitis. There is also an enlarged prostate with mass effect along the bladder but possible TURP defect. Please correlate with history. Small right inguinal fat containing hernia with some fluid in the inguinal canal more caudal at the edge of the imaging field. If there is further concern of the inguinal canal in the scrotum, ultrasound may be useful when clinically appropriate Electronically Signed   By: Karen Kays M.D.   On: 10/10/2022 12:11   DG Chest Port 1 View  Result Date: 10/10/2022 CLINICAL DATA:  Provided history: Questionable sepsis-evaluate for abnormality. EXAM: PORTABLE CHEST 1 VIEW COMPARISON:  Chest CT 03/28/2022. Prior chest radiographs 03/28/2022 and earlier FINDINGS: Heart size within normal limits. Aortic atherosclerosis.  No appreciable airspace consolidation. No evidence of pleural effusion or pneumothorax. No acute osseous abnormality identified. IMPRESSION: 1. No evidence of an acute cardiopulmonary abnormality. 2. Aortic Atherosclerosis (ICD10-I70.0). Electronically Signed   By: Jackey Loge D.O.   On: 10/10/2022 10:34    DVT Prophylaxis -SCD/heparin AM Labs Ordered, also please review Full Orders  Family Communication: Admission, patients condition and plan of care including tests being ordered have been discussed with the patient who indicate understanding and agree with the plan   Condition  -stable  Shon Hale M.D on 10/10/2022 at 8:50 PM Go to [email or web address] -  for contact  info  [facility] - Office  [phone]

## 2022-10-10 NOTE — Progress Notes (Signed)
Patient had one emesis during shift after dinner, yellow in color. Patient reported no nausea. MD Courage made aware.

## 2022-10-10 NOTE — Progress Notes (Signed)
Patient verbalized no complaints of pain during shift. Complained of nausea per ED nurse patient had one emesis prior to arrival to unit, given PRN Zofran was effective. Patient tolerated medications whole.

## 2022-10-10 NOTE — Telephone Encounter (Signed)
Wife called in for samples of Myrbetriq 25 mg sample. Wife is aware samples are up front. Wife states patient has been taken  to the hospital and not sure if he has a UTI.

## 2022-10-10 NOTE — Progress Notes (Signed)
Pharmacy Antibiotic Note  Barry Powers is a 77 y.o. male admitted on 10/10/2022 with  orchitis .  Pharmacy has been consulted for cefepime dosing.  Plan: Cefepime 2g q12 hours  Height: 5\' 9"  (175.3 cm) Weight: 76.7 kg (169 lb) IBW/kg (Calculated) : 70.7  Temp (24hrs), Avg:99.4 F (37.4 C), Min:98 F (36.7 C), Max:100.3 F (37.9 C)  Recent Labs  Lab 10/10/22 1012 10/10/22 1248  WBC 24.3*  --   CREATININE 1.72*  --   LATICACIDVEN 1.8 2.1*    Estimated Creatinine Clearance: 36.5 mL/min (A) (by C-G formula based on SCr of 1.72 mg/dL (H)).    Allergies  Allergen Reactions   Baclofen Other (See Comments)    Bad dreams    Penicillins Other (See Comments)    "Blacked-out"    Thank you for allowing pharmacy to be a part of this patient's care.  Sheppard Coil PharmD., BCPS Clinical Pharmacist 10/10/2022 3:14 PM

## 2022-10-10 NOTE — ED Triage Notes (Signed)
Pt. BIB [facility] complaining of weakness since yesterday, mainly trouble with walking. EMS did state pt.'s urine was dark and foul smelling. Temp her in ED is 100.3 oral. Patient's right testicle is very swollen and painful to touch, pt. Notes it has been like that for a "few days". CBG with EMS was 121

## 2022-10-10 NOTE — Progress Notes (Signed)
Pharmacy Antibiotic Note  Barry Powers is a 77 y.o. male admitted on 10/10/2022 with epididymitis .  Pharmacy has been consulted for levaquin dosing.  Plan: Levaquin 500 mg IV every 24 hours.  Monitor renal function for reduction in dosing.  Height: 5\' 9"  (175.3 cm) Weight: 77.2 kg (170 lb 3.1 oz) IBW/kg (Calculated) : 70.7  Temp (24hrs), Avg:99.2 F (37.3 C), Min:98 F (36.7 C), Max:100.3 F (37.9 C)  Recent Labs  Lab 10/10/22 1012 10/10/22 1248  WBC 24.3*  --   CREATININE 1.72*  --   LATICACIDVEN 1.8 2.1*    Estimated Creatinine Clearance: 36.5 mL/min (A) (by C-G formula based on SCr of 1.72 mg/dL (H)).    Allergies  Allergen Reactions   Baclofen Other (See Comments)    Bad dreams    Penicillins Other (See Comments)    "Blacked-out"    Antimicrobials this admission: Levaquin 7/2 >> Cefepime 7/2 CTX 7/2   Microbiology results: 7/2 BCx: pending 7/2 Ucx: Pending  Thank you for allowing pharmacy to be a part of this patient's care.  Tad Moore 10/10/2022 9:10 PM

## 2022-10-10 NOTE — ED Provider Notes (Signed)
[name] EMERGENCY DEPARTMENT AT [facility] Provider Note   CSN: [id number] Arrival date & time: 10/10/22  4098     History  Chief Complaint  Patient presents with   Weakness    Barry Powers is a 77 y.o. male.  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  Presents the ER today complaining of weakness in his legs, right testicular swelling and pain.  He states 2 days ago he started with dysuria and testicle pain on the right side with swelling and tenderness.  He states yesterday began to have weakness in his bilateral legs, was able to walk some with a walker but his legs gave out and eventually he had to crawl.  Today he is not able to ambulate at all.  She denies nausea or vomiting, has some lower abdominal pain, no back pain, no chest pain or shortness of breath.  No numbness or tingling.  Has any injury or significant pain in his legs   Weakness      Home Medications Prior to Admission medications   Medication Sig Start Date End Date Taking? Authorizing Provider  albuterol (VENTOLIN HFA) 108 (90 Base) MCG/ACT inhaler Inhale 2 puffs into the lungs every 6 (six) hours as needed for wheezing or shortness of breath. 03/31/22  Yes Erick Blinks, MD  aspirin EC 81 MG tablet Take 81 mg by mouth daily.   Yes [provider]  metoprolol succinate (TOPROL-XL) 25 MG 24 hr tablet Take 1 tablet (25 mg total) by mouth daily. 04/11/22  Yes Mallipeddi, Vishnu P, MD  mirabegron ER (MYRBETRIQ) 25 MG TB24 tablet Take 1 tablet (25 mg total) by mouth daily. 10/10/22  Yes McKenzie, Mardene Heli Dino, MD  rosuvastatin (CRESTOR) 40 MG tablet Take 1 tablet (40 mg total) by mouth daily. 01/31/22 01/26/23 Yes Mallipeddi, Vishnu P, MD  HYDROcodone-acetaminophen (NORCO) 5-325 MG tablet Take 1 tablet by mouth every 6 (six) hours as needed for moderate pain. Patient not taking: Reported on 10/10/2022 07/25/22   Malen Gauze, MD  nitroGLYCERIN (NITROSTAT) 0.4 MG SL tablet  Place 1 tablet (0.4 mg total) under the tongue every 5 (five) minutes as needed for chest pain. 01/31/22   Wendall Stade, MD      Allergies    Baclofen and Penicillins    Review of Systems   Review of Systems  Neurological:  Positive for weakness.    Physical Exam Updated Vital Signs BP (!) 146/72   Pulse 84   Temp 99 F (37.2 C) (Oral)   Resp 20   Ht 5\' 9"  (1.753 m)   Wt 77.2 kg   SpO2 96%   BMI 25.13 kg/m  Physical Exam Vitals and nursing note reviewed. Exam conducted with a chaperone present.  Constitutional:      General: He is not in acute distress.    Appearance: He is well-developed.  HENT:     Head: Normocephalic and atraumatic.     Mouth/Throat:     Mouth: Mucous membranes are moist.  Eyes:     Conjunctiva/sclera: Conjunctivae normal.  Cardiovascular:     Rate and Rhythm: Normal rate and regular rhythm.     Heart sounds: No murmur heard. Pulmonary:     Effort: Pulmonary effort is normal. No respiratory distress.     Breath sounds: Normal breath sounds.  Abdominal:     Palpations: Abdomen is soft.     Tenderness: There is no abdominal tenderness. There is no  right CVA tenderness, left CVA tenderness or rebound. Negative signs include Murphy's sign and McBurney's sign.     Hernia: No hernia is present.  Genitourinary:    Penis: Normal.      Comments: Edema to the right side of the scrotum, tenderness to right testicle, no crepitus.  Mild scrotal swelling Musculoskeletal:        General: No swelling.     Cervical back: Neck supple.     Right lower leg: No edema.  Skin:    General: Skin is warm and dry.     Capillary Refill: Capillary refill takes less than 2 seconds.  Neurological:     General: No focal deficit present.     Mental Status: He is alert and oriented to person, place, and time.     Sensory: No sensory deficit.     Comments: Strength 5 out of 5 in bilateral upper extremities, 2 out of 5 in bilateral lower extremities  Psychiatric:         Mood and Affect: Mood normal.     ED Results / Procedures / Treatments   Labs (all labs ordered are listed, but only abnormal results are displayed) Labs Reviewed  COMPREHENSIVE METABOLIC PANEL - Abnormal; Notable for the following components:      Result Value   Glucose, Bld 120 (*)    Creatinine, Ser 1.72 (*)    Calcium 8.8 (*)    Total Bilirubin 1.3 (*)    GFR, Estimated 41 (*)    All other components within normal limits  CBC WITH DIFFERENTIAL/PLATELET - Abnormal; Notable for the following components:   WBC 24.3 (*)    RDW 15.8 (*)    Platelets 143 (*)    Neutro Abs 19.4 (*)    Monocytes Absolute 2.1 (*)    Abs Immature Granulocytes 0.34 (*)    All other components within normal limits  URINALYSIS, ROUTINE W REFLEX MICROSCOPIC - Abnormal; Notable for the following components:   Color, Urine AMBER (*)    APPearance TURBID (*)    Hgb urine dipstick MODERATE (*)    Ketones, ur 5 (*)    Protein, ur 100 (*)    Leukocytes,Ua MODERATE (*)    Bacteria, UA RARE (*)    All other components within normal limits  LACTIC ACID, PLASMA - Abnormal; Notable for the following components:   Lactic Acid, Venous 2.1 (*)    All other components within normal limits  CULTURE, BLOOD (ROUTINE X 2)  CULTURE, BLOOD (ROUTINE X 2)  URINE CULTURE  LACTIC ACID, PLASMA  PROTIME-INR  APTT  CBC  BASIC METABOLIC PANEL    EKG EKG Interpretation Date/Time:  Tuesday October 10 2022 09:41:53 EDT Ventricular Rate:  87 PR Interval:  126 QRS Duration:  100 QT Interval:  364 QTC Calculation: 438 R Axis:   -31  Text Interpretation: Sinus rhythm Abnormal R-wave progression, late transition LVH with secondary repolarization abnormality Confirmed by Vonita Moss [phone]) on 10/10/2022 12:21:13 PM  Radiology US SCROTUM W/DOPPLER  Result Date: 10/10/2022 CLINICAL DATA:  77 year old male with RIGHT scrotal pain and swelling. EXAM: SCROTAL ULTRASOUND DOPPLER ULTRASOUND OF THE TESTICLES TECHNIQUE: Complete  ultrasound examination of the testicles, epididymis, and other scrotal structures was performed. Color and spectral Doppler ultrasound were also utilized to evaluate blood flow to the testicles. COMPARISON:  None Available. FINDINGS: Right testicle Measurements: 4.6 x 3.3 x 3.6 cm. No mass or microlithiasis visualized. Increased vascularity to the RIGHT testicle is noted. Left testicle Measurements:  4.5 x 2.8 x 2.5 cm. No mass or microlithiasis visualized. Right epididymis: UPPER limits of normal RIGHT epididymis size noted with increased vascularity. Left epididymis:  Normal in size and appearance. Hydrocele: A small to moderate RIGHT hydrocele and small LEFT hydrocele are noted. Varicocele:  None visualized. Pulsed Doppler interrogation of both testes demonstrates normal low resistance arterial and venous waveforms bilaterally. IMPRESSION: 1. Increased vascularity to the RIGHT epididymis and RIGHT testicle likely representing RIGHT epididymo-orchitis. 2. Small to moderate RIGHT hydrocele and small LEFT hydrocele. 3. No evidence of testicular torsion or mass. Electronically Signed   By: Harmon Pier M.D.   On: 10/10/2022 13:57   CT ABDOMEN PELVIS W CONTRAST  Result Date: 10/10/2022 CLINICAL DATA:  Abdominal pain.  Sepsis.  Swelling EXAM: CT ABDOMEN AND PELVIS WITH CONTRAST TECHNIQUE: Multidetector CT imaging of the abdomen and pelvis was performed using the standard protocol following bolus administration of intravenous contrast. RADIATION DOSE REDUCTION: This exam was performed according to the departmental dose-optimization program which includes automated exposure control, adjustment of the mA and/or kV according to patient size and/or use of iterative reconstruction technique. CONTRAST:  OMNIPAQUE IOHEXOL 300 MG/ML  SOLN COMPARISON:  None Available. FINDINGS: Lower chest: There is some linear opacity lung bases likely scar or atelectasis. No pleural effusion. There is breathing motion at the lung bases.  Hepatobiliary: Patent portal vein. Gallbladder has some dependent stones. Gallbladder is mildly distended. There are some tiny low-attenuation lesion seen in the liver which are too small to completely characterize such as series 2, image 18. Favor a benign cystic lesion statistically. No specific imaging follow-up. Separate hepatic granuloma on series 2, image 15. Pancreas: Unremarkable. No pancreatic ductal dilatation or surrounding inflammatory changes. Spleen: Small cystic area along the posteroinferior aspect of the spleen. Too small to completely characterize. Again likely a benign cystic lesion. Adrenals/Urinary Tract: The adrenal glands are preserved. Parapelvic lower pole left-sided renal cysts. No enhancing renal mass. No collecting system dilatation. The ureters have normal course and caliber extending down to the bladder. Bladder wall is thickened and trabeculated with some left-sided bladder diverticula. Heterogeneous enlarged prostate with mass effect along the base of the bladder. Possible TURP defect. Please correlate with history. Stomach/Bowel: No oral contrast. Stomach and small bowel are nondilated. Large bowel has a normal course and caliber with scattered stool. Slightly redundant course of the sigmoid colon. Normal appendix. Vascular/Lymphatic: Aortic atherosclerosis. No enlarged abdominal or pelvic lymph nodes. Reproductive: Heterogeneous prostate with mass effect along the base of the bladder. Possible TURP defect. Please correlate with the history. Other: Small fat containing right inguinal hernia. There is some fluid along the right inguinal canal at the edge of the imaging field. Recommend follow up scrotal ultrasound when appropriate if there is further concern. Musculoskeletal: Mild curvature of the spine. Scattered degenerative changes of the spine and pelvis. Transitional lumbosacral segment. IMPRESSION: No bowel obstruction, free air or free fluid.  Normal appendix. Gallstones in the  mildly distended gallbladder. If there is further concern of acute gallbladder pathology, ultrasound may be useful when clinically appropriate. Trabeculated urinary bladder wall thickening with some stranding and diverticula formation. Please correlate for any history of cystitis. There is also an enlarged prostate with mass effect along the bladder but possible TURP defect. Please correlate with history. Small right inguinal fat containing hernia with some fluid in the inguinal canal more caudal at the edge of the imaging field. If there is further concern of the inguinal canal in the scrotum,  ultrasound may be useful when clinically appropriate Electronically Signed   By: Karen Kays M.D.   On: 10/10/2022 12:11   DG Chest Port 1 View  Result Date: 10/10/2022 CLINICAL DATA:  Provided history: Questionable sepsis-evaluate for abnormality. EXAM: PORTABLE CHEST 1 VIEW COMPARISON:  Chest CT 03/28/2022. Prior chest radiographs 03/28/2022 and earlier FINDINGS: Heart size within normal limits. Aortic atherosclerosis. No appreciable airspace consolidation. No evidence of pleural effusion or pneumothorax. No acute osseous abnormality identified. IMPRESSION: 1. No evidence of an acute cardiopulmonary abnormality. 2. Aortic Atherosclerosis (ICD10-I70.0). Electronically Signed   By: Jackey Loge D.O.   On: 10/10/2022 10:34    Procedures .Critical Care  Performed by: Ma Rings, PA-C Authorized by: Ma Rings, PA-C   Critical care provider statement:    Critical care time (minutes):  30   Critical care was time spent personally by me on the following activities:  Development of treatment plan with patient or surrogate, discussions with consultants, evaluation of patient's response to treatment, examination of patient, ordering and review of laboratory studies, ordering and review of radiographic studies, ordering and performing treatments and interventions, pulse oximetry, re-evaluation of patient's  condition and review of old charts   Care discussed with: admitting provider       Medications Ordered in ED Medications  ceFEPIme (MAXIPIME) 2 g in sodium chloride 0.9 % 100 mL IVPB (2 g Intravenous New Bag/Given 10/10/22 1642)  aspirin EC tablet 81 mg (81 mg Oral Given 10/10/22 1746)  metoprolol succinate (TOPROL-XL) 24 hr tablet 25 mg (25 mg Oral Given 10/10/22 1745)  rosuvastatin (CRESTOR) tablet 40 mg (40 mg Oral Given 10/10/22 1745)  mirabegron ER (MYRBETRIQ) tablet 25 mg (25 mg Oral Given 10/10/22 1747)  albuterol (PROVENTIL) (2.5 MG/3ML) 0.083% nebulizer solution 2.5 mg (has no administration in time range)  sodium chloride flush (NS) 0.9 % injection 3 mL (has no administration in time range)  sodium chloride flush (NS) 0.9 % injection 3 mL (has no administration in time range)  sodium chloride flush (NS) 0.9 % injection 3 mL (has no administration in time range)  0.9 %  sodium chloride infusion ( Intravenous New Bag/Given 10/10/22 1640)  acetaminophen (TYLENOL) tablet 650 mg (has no administration in time range)    Or  acetaminophen (TYLENOL) suppository 650 mg (has no administration in time range)  traZODone (DESYREL) tablet 50 mg (has no administration in time range)  polyethylene glycol (MIRALAX / GLYCOLAX) packet 17 g (has no administration in time range)  bisacodyl (DULCOLAX) suppository 10 mg (has no administration in time range)  ondansetron (ZOFRAN) tablet 4 mg ( Oral See Alternative 10/10/22 1630)    Or  ondansetron (ZOFRAN) injection 4 mg (4 mg Intravenous Given 10/10/22 1630)  heparin injection 5,000 Units (has no administration in time range)  lactated ringers bolus 500 mL (0 mLs Intravenous Stopped 10/10/22 1512)  morphine (PF) 4 MG/ML injection 4 mg (4 mg Intravenous Given 10/10/22 1129)  iohexol (OMNIPAQUE) 300 MG/ML solution 100 mL (100 mLs Intravenous Contrast Given 10/10/22 1111)  vancomycin (VANCOCIN) IVPB 1000 mg/200 mL premix (0 mg Intravenous Stopped 10/10/22 1512)  cefTRIAXone  (ROCEPHIN) 1 g in sodium chloride 0.9 % 100 mL IVPB (0 g Intravenous Stopped 10/10/22 1352)  ondansetron (ZOFRAN) injection 4 mg (4 mg Intravenous Given 10/10/22 1926)    ED Course/ Medical Decision Making/ A&P Clinical Course as of 10/10/22 1938  Tue Oct 10, 2022  0952 Patient presents with right testicular pain and swelling, weakness in his  legs and lower abdominal pain.  Started with the testicular pain and swelling 2 days ago along with dysuria, worsened throughout the past 2 days.  Temperature orally is 100.3 F, pending labs and imaging.  Patient is not acutely toxic appearing.  His blood pressure is normal.  Heart rate is normal, beta-blockers which could be masking tachycardia. [CB]    Clinical Course User Index [CB] Ma Rings, PA-C                             Medical Decision Making This patient presents to the ED for concern of leg weakness right scrotal swelling and pain and lower abdominal pain, this involves an extensive number of treatment options, and is a complaint that carries with it a high risk of complications and morbidity.  The differential diagnosis includes stroke, cord compression, sepsis, cellulitis, Fouriner  gangrene, orchitis, testicular torsion,other   Co morbidities that complicate the patient evaluation :   BPH, Hx of TURP   Additional history obtained:  Additional history obtained from EMR External records from outside source obtained and reviewed including notes, labs   Lab Tests:  I Ordered, and personally interpreted labs.  The pertinent results include: Leukoytosis 24.3, mild increase in serum creatinine from 1.47-1.72   Imaging Studies ordered:  I ordered imaging studies including CT abdomen/pelvis  I independently visualized and interpreted imaging which showed stranding around bladder, small gall stones I agree with the radiologist interpretation  [place] of testicles/scrotum shows creased vascularity to right testicle and right epididymis  likely representing epididymal orchitis   Cardiac Monitoring: / EKG:  The patient was maintained on a cardiac monitor.  I personally viewed and interpreted the cardiac monitored which showed an underlying rhythm of: sinus rhythm   Consultations Obtained:  I requested consultation with the hospitalist,  and discussed lab and imaging findings as well as pertinent plan - they recommend: admission   Problem List / ED Course / Critical interventions / Medication management  Presents the ER with generalized weakness, testicular pain and swelling lower abdominal pain, all started about 2 days ago.  Initially found to have oral temperature 100.3 F, heart rate was in the 92, normal blood pressure.  Noted patient is on beta-blocker which with blood in his tachycardic response, white blood cell count is 24.3.  Did not meet sepsis criteria with elevated respiratory rate, leukocytosis and heart rate over 90 with source of GI noted on labs patient had rates dysuria.  He does have history of BPH and had prior TURP.  Will discuss with hospitalist regarding whether they want to also start levofloxacin I ordered medication including morphine for pain  Reevaluation of the patient after these medicines showed that the patient improved I have reviewed the patients home medicines and have made adjustments as needed   Test / Admission - Considered:  Notable ultrasound of gallbladder patient has no right upper quadrant tenderness at all, do not feel he has acute gallbladder disease    Amount and/or Complexity of Data Reviewed Labs: ordered. Radiology: ordered. ECG/medicine tests: ordered.  Risk Prescription drug management. Decision regarding hospitalization.           Final Clinical Impression(s) / ED Diagnoses Final diagnoses:  Sepsis without acute organ dysfunction, due to unspecified organism [facility])  Epididymo-orchitis    Rx / DC Orders ED Discharge Orders     None          [place], [address]  A, PA-C 10/10/22 1939    Rondel Baton, MD 10/11/22 [phone]

## 2022-10-11 ENCOUNTER — Observation Stay (HOSPITAL_COMMUNITY): Payer: Medicare Other

## 2022-10-11 DIAGNOSIS — N453 Epididymo-orchitis: Secondary | ICD-10-CM | POA: Diagnosis present

## 2022-10-11 DIAGNOSIS — I444 Left anterior fascicular block: Secondary | ICD-10-CM | POA: Diagnosis present

## 2022-10-11 DIAGNOSIS — N451 Epididymitis: Secondary | ICD-10-CM | POA: Diagnosis not present

## 2022-10-11 DIAGNOSIS — R531 Weakness: Secondary | ICD-10-CM | POA: Diagnosis present

## 2022-10-11 DIAGNOSIS — I252 Old myocardial infarction: Secondary | ICD-10-CM | POA: Diagnosis not present

## 2022-10-11 DIAGNOSIS — K828 Other specified diseases of gallbladder: Secondary | ICD-10-CM | POA: Diagnosis present

## 2022-10-11 DIAGNOSIS — Z880 Allergy status to penicillin: Secondary | ICD-10-CM | POA: Diagnosis not present

## 2022-10-11 DIAGNOSIS — Z825 Family history of asthma and other chronic lower respiratory diseases: Secondary | ICD-10-CM | POA: Diagnosis not present

## 2022-10-11 DIAGNOSIS — Z9079 Acquired absence of other genital organ(s): Secondary | ICD-10-CM | POA: Diagnosis not present

## 2022-10-11 DIAGNOSIS — I10 Essential (primary) hypertension: Secondary | ICD-10-CM

## 2022-10-11 DIAGNOSIS — Z79899 Other long term (current) drug therapy: Secondary | ICD-10-CM | POA: Diagnosis not present

## 2022-10-11 DIAGNOSIS — N179 Acute kidney failure, unspecified: Secondary | ICD-10-CM

## 2022-10-11 DIAGNOSIS — I2510 Atherosclerotic heart disease of native coronary artery without angina pectoris: Secondary | ICD-10-CM | POA: Diagnosis present

## 2022-10-11 DIAGNOSIS — E7800 Pure hypercholesterolemia, unspecified: Secondary | ICD-10-CM | POA: Diagnosis present

## 2022-10-11 DIAGNOSIS — K4090 Unilateral inguinal hernia, without obstruction or gangrene, not specified as recurrent: Secondary | ICD-10-CM | POA: Diagnosis present

## 2022-10-11 DIAGNOSIS — R6520 Severe sepsis without septic shock: Secondary | ICD-10-CM | POA: Diagnosis present

## 2022-10-11 DIAGNOSIS — N1831 Chronic kidney disease, stage 3a: Secondary | ICD-10-CM | POA: Diagnosis present

## 2022-10-11 DIAGNOSIS — Z820 Family history of epilepsy and other diseases of the nervous system: Secondary | ICD-10-CM | POA: Diagnosis not present

## 2022-10-11 DIAGNOSIS — I129 Hypertensive chronic kidney disease with stage 1 through stage 4 chronic kidney disease, or unspecified chronic kidney disease: Secondary | ICD-10-CM | POA: Diagnosis present

## 2022-10-11 DIAGNOSIS — N390 Urinary tract infection, site not specified: Secondary | ICD-10-CM | POA: Diagnosis present

## 2022-10-11 DIAGNOSIS — Z881 Allergy status to other antibiotic agents status: Secondary | ICD-10-CM | POA: Diagnosis not present

## 2022-10-11 DIAGNOSIS — E872 Acidosis, unspecified: Secondary | ICD-10-CM | POA: Diagnosis present

## 2022-10-11 DIAGNOSIS — A4181 Sepsis due to Enterococcus: Secondary | ICD-10-CM | POA: Diagnosis present

## 2022-10-11 DIAGNOSIS — Z7982 Long term (current) use of aspirin: Secondary | ICD-10-CM | POA: Diagnosis not present

## 2022-10-11 DIAGNOSIS — N452 Orchitis: Secondary | ICD-10-CM | POA: Diagnosis not present

## 2022-10-11 DIAGNOSIS — N401 Enlarged prostate with lower urinary tract symptoms: Secondary | ICD-10-CM | POA: Diagnosis present

## 2022-10-11 DIAGNOSIS — K8020 Calculus of gallbladder without cholecystitis without obstruction: Secondary | ICD-10-CM | POA: Diagnosis present

## 2022-10-11 DIAGNOSIS — N138 Other obstructive and reflux uropathy: Secondary | ICD-10-CM | POA: Diagnosis present

## 2022-10-11 DIAGNOSIS — N433 Hydrocele, unspecified: Secondary | ICD-10-CM | POA: Diagnosis present

## 2022-10-11 LAB — BASIC METABOLIC PANEL
Anion gap: 8 (ref 5–15)
BUN: 21 mg/dL (ref 8–23)
CO2: 23 mmol/L (ref 22–32)
Calcium: 7.8 mg/dL — ABNORMAL LOW (ref 8.9–10.3)
Chloride: 105 mmol/L (ref 98–111)
Creatinine, Ser: 1.42 mg/dL — ABNORMAL HIGH (ref 0.61–1.24)
GFR, Estimated: 51 mL/min — ABNORMAL LOW (ref >60)
Glucose, Bld: 108 mg/dL — ABNORMAL HIGH (ref 70–99)
Potassium: 4.2 mmol/L (ref 3.5–5.1)
Sodium: 136 mmol/L (ref 135–145)

## 2022-10-11 LAB — CBC
HCT: 36.3 % — ABNORMAL LOW (ref 39.0–52.0)
Hemoglobin: 11.7 g/dL — ABNORMAL LOW (ref 13.0–17.0)
MCH: 28.2 pg (ref 26.0–34.0)
MCHC: 32.2 g/dL (ref 30.0–36.0)
MCV: 87.5 fL (ref 80.0–100.0)
Platelets: 131 10*3/uL — ABNORMAL LOW (ref 150–400)
RBC: 4.15 MIL/uL — ABNORMAL LOW (ref 4.22–5.81)
RDW: 15.8 % — ABNORMAL HIGH (ref 11.5–15.5)
WBC: 19.3 10*3/uL — ABNORMAL HIGH (ref 4.0–10.5)
nRBC: 0 % (ref 0.0–0.2)

## 2022-10-11 LAB — CULTURE, BLOOD (ROUTINE X 2)

## 2022-10-11 LAB — URINE CULTURE

## 2022-10-11 NOTE — Progress Notes (Signed)
Transition of Care [facility]) - Inpatient Brief Assessment   Patient Details  Name: Barry Powers MRN: [id number] Date of Birth: Jun 28, 1945  Transition of Care [facility]) CM/SW Contact:    Annice Needy, LCSW Phone Number: 10/11/2022, 11:22 AM   Clinical Narrative: Transition of Care Department [facility]) has reviewed patient and no TOC needs have been identified at this time. We will continue to monitor patient advancement through interdisciplinary progression rounds. If new patient transition needs arise, please place a TOC consult.    Transition of Care Asessment: Insurance and Status: Insurance coverage has been reviewed Patient has primary care physician: Yes Home environment has been reviewed: from home with spouse Prior level of function:: indep Prior/Current Home Services: No current home services Social Determinants of Health Reivew: SDOH reviewed no interventions necessary Readmission risk has been reviewed: Yes Transition of care needs: no transition of care needs at this time

## 2022-10-11 NOTE — Assessment & Plan Note (Addendum)
On mirabegron with no signs of urinary retention.

## 2022-10-11 NOTE — Progress Notes (Signed)
Pt continues with scrotal edema and reddness, pain with touch or movement but has denied need for pain medication today. Pt was assisted OOB to chair with max assist this am but was able to get from chair to bed with only one assist this afternoon. Tolerating clear liquids without any n/v. VSS today.

## 2022-10-11 NOTE — Assessment & Plan Note (Signed)
No chest pain.   Dyslipidemia, continue with statin therapy.

## 2022-10-11 NOTE — Progress Notes (Signed)
Pt assisted up from bed to recliner by nursing staff. Pt weak, had to pull on side rail and nurse's arm to get to standing position, unsteady on feet. Pt states, "I feel like my knee's gonna give way. I never know when its gonna happen and I fall!" Pt able to take shuffling steps to chair using FWW and nurse to assist.  (Pt refusing to be seen by PT staff because he states he got a bill for $628 on his last admission and he doesn't want to pay that again. MD & TOC aware.)  Pt advised to call for assistance, call bell and phone within reach. Pt states understanding.

## 2022-10-11 NOTE — Assessment & Plan Note (Addendum)
Urinary tract infection.  Symptoms continue to improve.  Follow up wbc is 8.5 and he has been afebrile.  Urine culture positive for enterococcus fecalis Testicular US with increased vascularity to the right epididymis and right testicle likely representing right epididymo orchitis.  Small to moderate right hydrocele and small left hydrocele.  No evidence of testicular torsion or mass.   Patient received IV levofloxacin during  his hospitalization with improvement in his symptoms, at the time of his discharge he will transitioned to oral levofloxacin, he is allergic to penicillin.    Pain control with acetaminophen. Follow up with urology as outpatient.

## 2022-10-11 NOTE — Hospital Course (Addendum)
Barry Powers was admitted to the hospital with the working diagnosis of epididymo-orchitis on the right.   77 yo male with the past medical history of BPH, coronary artery disease, hypertension and dyslipidemia who presented with right sided testicular pain, edema and erythema. Worsening symptoms for few days but worse over the last 24 prior to admission. On his initial physical examination his blood pressure was 121/68, HR 68, RR 20 and 02 saturation 94%, lungs with no wheezing or rales, heart with S1 and S2 present and rhythmic, abdomen with no distention, no  lower extremity edema.  Right scrotum with edema, erythema and tenderness, increased local temperature.   Na 136, K 3,9 Cl 102, bicarbonate 24, glucose 120, bun 18 cr 1,72  Lactic acid 1,8 and 2.1  Wbc 24 hgb 14.3 plt 143  Urine analysis SG 1,029, protein 100, moderate leukocytes and moderate Hgb.   Urine culture enterococcus fecalis.   Chest radiograph with no cardiomegaly, no infiltrates or effusions.   EKG 87 bpm, left axis deviation with left anterior fascicular block, sinus rhythm with no significant ST segment changes, negative T wave lead I and AvL. Positive LVH.   CT abdomen with positive gallstones. Urinary bladder wall thickening with some stranding and diverticula formation. Enlarged prostate. Small right inguinal hernia.   Patient was placed on IV antibiotics along with supportive medical therapy. 07/05 his symptoms have improved, patient will continue with oral antibiotic therapy as outpatient. Follow up with Urology as outpatient.

## 2022-10-11 NOTE — Progress Notes (Signed)
PT Cancellation Note  Patient Details Name: Barry Powers MRN: [id number] DOB: [birth year]   Cancelled Treatment:    Reason Eval/Treat Not Completed: Patient declined, no reason specified.  Patient does not want to pay for physical therapy - TOC and MD notified.   1:58 PM, 10/11/22 Ocie Bob, MPT Physical Therapist with [facility] [phone] office [phone] mobile phone

## 2022-10-11 NOTE — Progress Notes (Signed)
  Progress Note   Patient: Barry Powers UJW:[id number] DOB: October 16, 1945 DOA: 10/10/2022     0 DOS: the patient was seen and examined on 10/11/2022   Brief hospital course: [name] was admitted to the hospital with the working diagnosis of epididymo-orchitis on the right.   77 yo male with the past medical history of BPH, coronary artery disease, hypertension and dyslipidemia who presented with right sided testicular pain, edema and erythema. Worsening symptoms for few days but worse over the last 24 prior to admission.   Assessment and Plan: * Orchitis, epididymitis, and epididymo-orchitis Patient continue to have scrotal edema, tenderness and erythema.  Wbc is 19,3   Plan to continue antibiotic therapy with levofloxacin Pain control with acetaminophen and will add oral oxycodone for severe pain. Follow up with urology as outpatient.   Hypertension Continue blood pressure control with metoprolol.   Benign prostatic hyperplasia with urinary obstruction On mirabegron and tamsulosin, no signs of urinary retention.   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 Na 136  Patient is tolerating po well Hold on IV fluids  Follow up renal function and electrolytes in am    Coronary artery 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 No chest pain.   Dyslipidemia, continue with statin therapy.         Subjective: Patient continue to have right scrotal edema and erythema. Positive tender to palpation. He has declined to do PT   Physical Exam: Vitals:   10/10/22 1944 10/11/22 0414 10/11/22 1110 10/11/22 1315  BP: 121/68 131/66 128/60 (!) 118/59  Pulse: 68 67 (!) 59 (!) 55  Resp: 20 20 20    Temp: 98.8 F (37.1 C) 98.3 F (36.8 C) 97.7 F (36.5 C) 97.6 F (36.4 C)  TempSrc: Oral Oral Oral Oral  SpO2: 94% 94% 95% 98%  Weight:      Height:        Neurology awake and alert ENT with mild pallor Cardiovascular with S1 and S2 present and rhythmic with no gallops. Respiratory with no rales or wheezing Abdomen with no distention  Right scrotum with edema and mild erythema, tender to palpation  Data Reviewed:    Family Communication: no family at the bedside   Disposition: Status is: Observation The patient will require care spanning > 2 midnights and should be moved to inpatient because: IV antibiotics   Planned Discharge Destination: Home  Author: Coralie Keens, MD 10/11/2022 3:23 PM  For on call review [email or web address].

## 2022-10-11 NOTE — Assessment & Plan Note (Addendum)
Continue blood pressure control with metoprolol.  

## 2022-10-11 NOTE — Assessment & Plan Note (Addendum)
CKD stage 3a base serum cr 1,4  Renal function has been improving, at the time of his discharge his serum cr is 1,26 with K at 3,6 and serum bicarbonate at 22. Na 136.   Follow up renal function as outpatient.

## 2022-10-12 DIAGNOSIS — I2510 Atherosclerotic heart disease of native coronary artery without angina pectoris: Secondary | ICD-10-CM | POA: Diagnosis not present

## 2022-10-12 DIAGNOSIS — I10 Essential (primary) hypertension: Secondary | ICD-10-CM | POA: Diagnosis not present

## 2022-10-12 DIAGNOSIS — N179 Acute kidney failure, unspecified: Secondary | ICD-10-CM | POA: Diagnosis not present

## 2022-10-12 DIAGNOSIS — N452 Orchitis: Secondary | ICD-10-CM | POA: Diagnosis not present

## 2022-10-12 LAB — BASIC METABOLIC PANEL
Anion gap: 7 (ref 5–15)
BUN: 18 mg/dL (ref 8–23)
CO2: 23 mmol/L (ref 22–32)
Calcium: 8.1 mg/dL — ABNORMAL LOW (ref 8.9–10.3)
Chloride: 107 mmol/L (ref 98–111)
Creatinine, Ser: 1.34 mg/dL — ABNORMAL HIGH (ref 0.61–1.24)
GFR, Estimated: 55 mL/min — ABNORMAL LOW (ref >60)
Glucose, Bld: 90 mg/dL (ref 70–99)
Potassium: 3.9 mmol/L (ref 3.5–5.1)
Sodium: 137 mmol/L (ref 135–145)

## 2022-10-12 LAB — CBC WITH DIFFERENTIAL/PLATELET
Abs Immature Granulocytes: 0.02 10*3/uL (ref 0.00–0.07)
Basophils Absolute: 0.1 10*3/uL (ref 0.0–0.1)
Basophils Relative: 1 %
Eosinophils Absolute: 0.2 10*3/uL (ref 0.0–0.5)
Eosinophils Relative: 2 %
HCT: 35.5 % — ABNORMAL LOW (ref 39.0–52.0)
Hemoglobin: 11.6 g/dL — ABNORMAL LOW (ref 13.0–17.0)
Immature Granulocytes: 0 %
Lymphocytes Relative: 26 %
Lymphs Abs: 2.7 10*3/uL (ref 0.7–4.0)
MCH: 28.4 pg (ref 26.0–34.0)
MCHC: 32.7 g/dL (ref 30.0–36.0)
MCV: 86.8 fL (ref 80.0–100.0)
Monocytes Absolute: 0.7 10*3/uL (ref 0.1–1.0)
Monocytes Relative: 6 %
Neutro Abs: 6.8 10*3/uL (ref 1.7–7.7)
Neutrophils Relative %: 65 %
Platelets: 130 10*3/uL — ABNORMAL LOW (ref 150–400)
RBC: 4.09 MIL/uL — ABNORMAL LOW (ref 4.22–5.81)
RDW: 15.4 % (ref 11.5–15.5)
WBC: 10.4 10*3/uL (ref 4.0–10.5)
nRBC: 0 % (ref 0.0–0.2)

## 2022-10-12 LAB — URINE CULTURE: Culture: >=100000 — AB

## 2022-10-12 NOTE — Plan of Care (Signed)

## 2022-10-12 NOTE — Progress Notes (Signed)
Patient OOB for breakfast, given bath by NT . Patient scrotum remains swollen and tender , however he denies the need for pain medication . No c/o nausea or vomiting this shift. Will have regular diet resumed at dinner. Patient in bed , SCD's in place. Alert and oriented and he is able to make needs known. 1 assist back to bed, using urinal with adequate output .

## 2022-10-12 NOTE — Progress Notes (Addendum)
  Progress Note   Patient: Barry Powers:[id number] DOB: [birth year] DOA: 10/10/2022     1 DOS: the patient was seen and examined on 10/12/2022   Brief hospital course: [name] was admitted to the hospital with the working diagnosis of epididymo-orchitis on the right.   77 yo male with the past medical history of BPH, coronary artery disease, hypertension and dyslipidemia who presented with right sided testicular pain, edema and erythema. Worsening symptoms for few days but worse over the last 24 prior to admission. On his initial physical examination his blood pressure was 121/68, HR 68, RR 20 and 02 saturation 94%, lungs with no wheezing or rales, heart with S1 and S2 present and rhythmic, abdomen with no distention, no  lower extremity edema.  Right scrotum with edema, erythema and tenderness, increased local temperature.   Na 136, K 3,9 Cl 102, bicarbonate 24, glucose 120, bun 18 cr 1,72  Lactic acid 1,8 and 2.1  Wbc 24 hgb 14.3 plt 143  Urine analysis SG 1,029, protein 100, moderate leukocytes and moderate Hgb.   Urine culture enterococcus fecalis.   Chest radiograph with no cardiomegaly, no infiltrates or effusions.   EKG 87 bpm, left axis deviation with left anterior fascicular block, sinus rhythm with no significant ST segment changes, negative T wave lead I and AvL. Positive LVH.   CT abdomen with positive gallstones. Urinary bladder wall thickening with some stranding and diverticula formation. Enlarged prostate. Small right inguinal hernia.   Assessment and Plan: * Orchitis, epididymitis, and epididymo-orchitis Urinary tract infection.  Symptoms are improving today. Follow up wbc is 10.4 and he has been afebrile.  Urine culture positive for enterococcus fecalis  Plan to continue antibiotic therapy with IV levofloxacin Pain control with acetaminophen and oral oxycodone for severe pain. Follow up with urology as outpatient.   Hypertension Continue blood pressure  control with metoprolol.   Benign prostatic hyperplasia with urinary obstruction On mirabegron and tamsulosin, no signs of urinary retention.   AKI (acute kidney injury) (HCC) CKD stage 3a base serum cr 1,4  Today renal function is back to baseline, with serum cr at 1,34 with K at 3,9 and serum bicarbonate at 23.  Continue to hold on IV fluids.  Patient is tolerating po well.   Coronary artery 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 No chest pain.   Dyslipidemia, continue with statin therapy.         Subjective: Patient with no chest pain or dyspnea, testicular pain and edema is improving,   Physical Exam: Vitals:   10/11/22 2103 10/12/22 0434 10/12/22 0903 10/12/22 1233  BP: 116/61 131/69  125/62  Pulse: (!) 56 62 80 (!) 59  Resp: 16 18  16   Temp: 98.4 F (36.9 C) 97.8 F (36.6 C)  98.1 F (36.7 C)  TempSrc: Oral Oral  Oral  SpO2: 97% 96%  100%  Weight:      Height:       Neurology awake and alert ENT with no pallor Cardiovascular with S1 and S2 present and rhythmic Respiratory with no rales or wheezing Abdomen with no distention  No lower extremity edema  Data Reviewed:    Family Communication: no family at the bedside   Disposition: Status is: Inpatient Remains inpatient appropriate because: IV antibiotic therapy   Planned Discharge Destination: Home    Author: Coralie Keens, MD 10/12/2022 3:11 PM  For on call review [email or web address].

## 2022-10-13 DIAGNOSIS — I10 Essential (primary) hypertension: Secondary | ICD-10-CM | POA: Diagnosis not present

## 2022-10-13 DIAGNOSIS — N179 Acute kidney failure, unspecified: Secondary | ICD-10-CM | POA: Diagnosis not present

## 2022-10-13 DIAGNOSIS — N452 Orchitis: Secondary | ICD-10-CM | POA: Diagnosis not present

## 2022-10-13 DIAGNOSIS — I2510 Atherosclerotic heart disease of native coronary artery without angina pectoris: Secondary | ICD-10-CM | POA: Diagnosis not present

## 2022-10-13 LAB — BASIC METABOLIC PANEL
Anion gap: 5 (ref 5–15)
BUN: 15 mg/dL (ref 8–23)
CO2: 22 mmol/L (ref 22–32)
Calcium: 8 mg/dL — ABNORMAL LOW (ref 8.9–10.3)
Chloride: 109 mmol/L (ref 98–111)
Creatinine, Ser: 1.26 mg/dL — ABNORMAL HIGH (ref 0.61–1.24)
GFR, Estimated: 59 mL/min — ABNORMAL LOW (ref >60)
Glucose, Bld: 95 mg/dL (ref 70–99)
Potassium: 3.6 mmol/L (ref 3.5–5.1)
Sodium: 136 mmol/L (ref 135–145)

## 2022-10-13 LAB — CBC
HCT: 35.9 % — ABNORMAL LOW (ref 39.0–52.0)
Hemoglobin: 11.8 g/dL — ABNORMAL LOW (ref 13.0–17.0)
MCH: 28.3 pg (ref 26.0–34.0)
MCHC: 32.9 g/dL (ref 30.0–36.0)
MCV: 86.1 fL (ref 80.0–100.0)
Platelets: 144 10*3/uL — ABNORMAL LOW (ref 150–400)
RBC: 4.17 MIL/uL — ABNORMAL LOW (ref 4.22–5.81)
RDW: 15.1 % (ref 11.5–15.5)
WBC: 8.5 10*3/uL (ref 4.0–10.5)
nRBC: 0 % (ref 0.0–0.2)

## 2022-10-13 LAB — CULTURE, BLOOD (ROUTINE X 2): Culture: NO GROWTH

## 2022-10-13 MED ORDER — ACETAMINOPHEN 325 MG PO TABS: 650.0000 mg | ORAL_TABLET | Freq: Four times a day (QID) | ORAL | Status: AC | PRN

## 2022-10-13 MED ORDER — LEVOFLOXACIN 500 MG PO TABS: 500.0000 mg | ORAL_TABLET | Freq: Every day | ORAL | 0 refills | Status: AC

## 2022-10-13 MED ORDER — LEVOFLOXACIN 500 MG PO TABS
500.0000 mg | ORAL_TABLET | Freq: Every day | ORAL | Status: DC
  Filled 2022-10-13: qty 1

## 2022-10-13 NOTE — Discharge Summary (Signed)
Physician Discharge Summary   Patient: Barry Powers MRN: [id number] DOB: 07-22-1945  Admit date:     10/10/2022  Discharge date: 10/13/22  Discharge Physician: York Ram Alok Minshall   PCP: Gilmore Laroche, FNP   Recommendations at discharge:    Patient will continue antibiotic therapy with levofloxacin to complete 14 days. Follow up with Urology as outpatient. Follow up with Dr Denny Levy in 7 to 10 days. Patient declined home health services, home physical therapy.   Discharge Diagnoses: Principal Problem:   Orchitis, epididymitis, and epididymo-orchitis Active Problems:   Hypertension   Benign prostatic hyperplasia with urinary obstruction   AKI (acute kidney injury) (HCC)   Coronary artery 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   Orchitis and epididymitis  Resolved Problems:   * No resolved hospital problems. [facility] Course: Barry Powers was admitted to the hospital with the working diagnosis of epididymo-orchitis on the right.   77 yo male with the past medical history of BPH, coronary artery disease, hypertension and dyslipidemia who presented with right sided testicular pain, edema and erythema. Worsening symptoms for few days but worse over the last 24 prior to admission. On his initial physical examination his blood pressure was 121/68, HR 68, RR 20 and 02 saturation 94%, lungs with no wheezing or rales, heart with S1 and S2 present and rhythmic, abdomen with no distention, no  lower extremity edema.  Right scrotum with edema, erythema and tenderness, increased local temperature.   Na 136, K 3,9 Cl 102, bicarbonate 24, glucose 120, bun 18 cr 1,72  Lactic acid 1,8 and 2.1  Wbc 24 hgb 14.3 plt 143  Urine analysis SG 1,029, protein 100, moderate leukocytes and moderate Hgb.   Urine culture enterococcus fecalis.   Chest radiograph with no cardiomegaly, no infiltrates or effusions.   EKG 87 bpm, left axis deviation with left  anterior fascicular block, sinus rhythm with no significant ST segment changes, negative T wave lead I and AvL. Positive LVH.   CT abdomen with positive gallstones. Urinary bladder wall thickening with some stranding and diverticula formation. Enlarged prostate. Small right inguinal hernia.   Patient was placed on IV antibiotics along with supportive medical therapy. 07/05 his symptoms have improved, patient will continue with oral antibiotic therapy as outpatient. Follow up with Urology as outpatient.   Assessment and Plan: * Orchitis, epididymitis, and epididymo-orchitis Urinary tract infection.  Symptoms continue to improve.  Follow up wbc is 8.5 and he has been afebrile.  Urine culture positive for enterococcus fecalis Testicular US with increased vascularity to the right epididymis and right testicle likely representing right epididymo orchitis.  Small to moderate right hydrocele and small left hydrocele.  No evidence of testicular torsion or mass.   Patient received IV levofloxacin during  his hospitalization with improvement in his symptoms, at the time of his discharge he will transitioned to oral levofloxacin, he is allergic to penicillin.    Pain control with acetaminophen. Follow up with urology as outpatient.   Hypertension Continue blood pressure control with metoprolol.   Dyslipidemia, continue with statin therapy.  Benign prostatic hyperplasia with urinary obstruction On mirabegron with no signs of urinary retention.   AKI (acute kidney injury) (HCC) CKD stage 3a base serum cr 1,4  Renal function has been improving, at the time of his discharge his serum cr is 1,26 with K at 3,6 and serum bicarbonate at 22. Na 136.   Follow up renal function as outpatient.  Coronary artery 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 No chest pain.   Dyslipidemia, continue with statin therapy.          Consultants: Urology over the  phone  Procedures performed: none   Disposition: Home Diet recommendation:  Cardiac diet DISCHARGE MEDICATION: Allergies as of 10/13/2022       Reactions   Baclofen Other (See Comments)   Bad dreams    Penicillins Other (See Comments)   "Blacked-out"        Medication List     STOP taking these medications    HYDROcodone-acetaminophen 5-325 MG tablet Commonly known as: Norco       TAKE these medications    acetaminophen 325 MG tablet Commonly known as: TYLENOL Take 2 tablets (650 mg total) by mouth every 6 (six) hours as needed for mild pain or moderate pain (or Fever >/= 101).   albuterol 108 (90 Base) MCG/ACT inhaler Commonly known as: VENTOLIN HFA Inhale 2 puffs into the lungs every 6 (six) hours as needed for wheezing or shortness of breath.   aspirin EC 81 MG tablet Take 81 mg by mouth daily.   levofloxacin 500 MG tablet Commonly known as: LEVAQUIN Take 1 tablet (500 mg total) by mouth daily for 11 days. Start taking on: October 14, 2022   metoprolol succinate 25 MG 24 hr tablet Commonly known as: TOPROL-XL Take 1 tablet (25 mg total) by mouth daily.   mirabegron ER 25 MG Tb24 tablet Commonly known as: MYRBETRIQ Take 1 tablet (25 mg total) by mouth daily.   nitroGLYCERIN 0.4 MG SL tablet Commonly known as: NITROSTAT Place 1 tablet (0.4 mg total) under the tongue every 5 (five) minutes as needed for chest pain.   rosuvastatin 40 MG tablet Commonly known as: CRESTOR Take 1 tablet (40 mg total) by mouth daily.        Discharge Exam: Filed Weights   10/10/22 0948 10/10/22 1619  Weight: 76.7 kg 77.2 kg   BP 130/72 (BP Location: Right Arm)   Pulse 60   Temp 98.4 F (36.9 C) (Oral)   Resp 20   Ht 5\' 9"  (1.753 m)   Wt 77.2 kg   SpO2 96%   BMI 25.13 kg/m   Patient is feeling better, no nausea or vomiting, right scrotal pain and edema continue to improve.   Neurology awake and alert ENT with no pallor Cardiovascular with S1 and S2 present and  rhythmic Respiratory with no rales or wheezing Abdomen with no distention  No lower extremity edema Right scrotum with edema and mild tenderness, erythema has improved, no perineal erythema, edema or pain.   Condition at discharge: stable  The results of significant diagnostics from this hospitalization (including imaging, microbiology, ancillary and laboratory) are listed below for reference.   Imaging Studies: US SCROTUM W/DOPPLER  Result Date: 10/10/2022 CLINICAL DATA:  77 year old male with RIGHT scrotal pain and swelling. EXAM: SCROTAL ULTRASOUND DOPPLER ULTRASOUND OF THE TESTICLES TECHNIQUE: Complete ultrasound examination of the testicles, epididymis, and other scrotal structures was performed. Color and spectral Doppler ultrasound were also utilized to evaluate blood flow to the testicles. COMPARISON:  None Available. FINDINGS: Right testicle Measurements: 4.6 x 3.3 x 3.6 cm. No mass or microlithiasis visualized. Increased vascularity to the RIGHT testicle is noted. Left testicle Measurements: 4.5 x 2.8 x 2.5 cm. No mass or microlithiasis visualized. Right epididymis: UPPER limits of normal RIGHT epididymis size noted with increased vascularity. Left epididymis:  Normal in size  and appearance. Hydrocele: A small to moderate RIGHT hydrocele and small LEFT hydrocele are noted. Varicocele:  None visualized. Pulsed Doppler interrogation of both testes demonstrates normal low resistance arterial and venous waveforms bilaterally. IMPRESSION: 1. Increased vascularity to the RIGHT epididymis and RIGHT testicle likely representing RIGHT epididymo-orchitis. 2. Small to moderate RIGHT hydrocele and small LEFT hydrocele. 3. No evidence of testicular torsion or mass. Electronically Signed   By: Harmon Pier M.D.   On: 10/10/2022 13:57   CT ABDOMEN PELVIS W CONTRAST  Result Date: 10/10/2022 CLINICAL DATA:  Abdominal pain.  Sepsis.  Swelling EXAM: CT ABDOMEN AND PELVIS WITH CONTRAST TECHNIQUE: Multidetector  CT imaging of the abdomen and pelvis was performed using the standard protocol following bolus administration of intravenous contrast. RADIATION DOSE REDUCTION: This exam was performed according to the departmental dose-optimization program which includes automated exposure control, adjustment of the mA and/or kV according to patient size and/or use of iterative reconstruction technique. CONTRAST:  OMNIPAQUE IOHEXOL 300 MG/ML  SOLN COMPARISON:  None Available. FINDINGS: Lower chest: There is some linear opacity lung bases likely scar or atelectasis. No pleural effusion. There is breathing motion at the lung bases. Hepatobiliary: Patent portal vein. Gallbladder has some dependent stones. Gallbladder is mildly distended. There are some tiny low-attenuation lesion seen in the liver which are too small to completely characterize such as series 2, image 18. Favor a benign cystic lesion statistically. No specific imaging follow-up. Separate hepatic granuloma on series 2, image 15. Pancreas: Unremarkable. No pancreatic ductal dilatation or surrounding inflammatory changes. Spleen: Small cystic area along the posteroinferior aspect of the spleen. Too small to completely characterize. Again likely a benign cystic lesion. Adrenals/Urinary Tract: The adrenal glands are preserved. Parapelvic lower pole left-sided renal cysts. No enhancing renal mass. No collecting system dilatation. The ureters have normal course and caliber extending down to the bladder. Bladder wall is thickened and trabeculated with some left-sided bladder diverticula. Heterogeneous enlarged prostate with mass effect along the base of the bladder. Possible TURP defect. Please correlate with history. Stomach/Bowel: No oral contrast. Stomach and small bowel are nondilated. Large bowel has a normal course and caliber with scattered stool. Slightly redundant course of the sigmoid colon. Normal appendix. Vascular/Lymphatic: Aortic atherosclerosis. No  enlarged abdominal or pelvic lymph nodes. Reproductive: Heterogeneous prostate with mass effect along the base of the bladder. Possible TURP defect. Please correlate with the history. Other: Small fat containing right inguinal hernia. There is some fluid along the right inguinal canal at the edge of the imaging field. Recommend follow up scrotal ultrasound when appropriate if there is further concern. Musculoskeletal: Mild curvature of the spine. Scattered degenerative changes of the spine and pelvis. Transitional lumbosacral segment. IMPRESSION: No bowel obstruction, free air or free fluid.  Normal appendix. Gallstones in the mildly distended gallbladder. If there is further concern of acute gallbladder pathology, ultrasound may be useful when clinically appropriate. Trabeculated urinary bladder wall thickening with some stranding and diverticula formation. Please correlate for any history of cystitis. There is also an enlarged prostate with mass effect along the bladder but possible TURP defect. Please correlate with history. Small right inguinal fat containing hernia with some fluid in the inguinal canal more caudal at the edge of the imaging field. If there is further concern of the inguinal canal in the scrotum, ultrasound may be useful when clinically appropriate Electronically Signed   By: Karen Kays M.D.   On: 10/10/2022 12:11   DG Chest [facility] 1 View  Result  Date: 10/10/2022 CLINICAL DATA:  Provided history: Questionable sepsis-evaluate for abnormality. EXAM: PORTABLE CHEST 1 VIEW COMPARISON:  Chest CT 03/28/2022. Prior chest radiographs 03/28/2022 and earlier FINDINGS: Heart size within normal limits. Aortic atherosclerosis. No appreciable airspace consolidation. No evidence of pleural effusion or pneumothorax. No acute osseous abnormality identified. IMPRESSION: 1. No evidence of an acute cardiopulmonary abnormality. 2. Aortic Atherosclerosis (ICD10-I70.0). Electronically Signed   By: Jackey Loge D.O.    On: 10/10/2022 10:34    Microbiology: Results for orders placed or performed during the hospital encounter of 10/10/22  Blood Culture (routine x 2)     Status: None (Preliminary result)   Collection Time: 10/10/22 10:12 AM   Specimen: Left Antecubital; Blood  Result Value Ref Range Status   Specimen Description LEFT ANTECUBITAL  Final   Special Requests   Final    BOTTLES DRAWN AEROBIC AND ANAEROBIC Blood Culture results may not be optimal due to an excessive volume of blood received in culture bottles   Culture   Final    NO GROWTH 3 DAYS Performed at [facility], [address] [place], [place] [address]    Report Status PENDING  Incomplete  Blood Culture (routine x 2)     Status: None (Preliminary result)   Collection Time: 10/10/22 10:28 AM   Specimen: BLOOD RIGHT HAND  Result Value Ref Range Status   Specimen Description BLOOD RIGHT HAND  Final   Special Requests   Final    BOTTLES DRAWN AEROBIC AND ANAEROBIC Blood Culture adequate volume   Culture   Final    NO GROWTH 3 DAYS Performed at [facility], [address], [place], [place] [address]    Report Status PENDING  Incomplete  Urine Culture     Status: Abnormal   Collection Time: 10/10/22 11:31 AM   Specimen: Urine, Clean Catch  Result Value Ref Range Status   Specimen Description   Final    URINE, CLEAN CATCH Performed at [facility], [address] [place], [place] [address]    Special Requests   Final    NONE Performed at [facility], [address] [place], [place] [address]    Culture >=100,000 COLONIES/mL ENTEROCOCCUS FAECALIS (A)  Final   Report Status 10/12/2022 FINAL  Final   Organism ID, Bacteria ENTEROCOCCUS FAECALIS (A)  Final      Susceptibility   Enterococcus faecalis - MIC*    AMPICILLIN <=2 SENSITIVE Sensitive     NITROFURANTOIN <=16 SENSITIVE Sensitive     VANCOMYCIN 1 SENSITIVE Sensitive     * >=100,000 COLONIES/mL ENTEROCOCCUS FAECALIS    Labs: CBC: Recent Labs  Lab 10/10/22 1012  10/11/22 0427 10/12/22 0354 10/13/22 0524  WBC 24.3* 19.3* 10.4 8.5  NEUTROABS 19.4*  --  6.8  --   HGB 14.3 11.7* 11.6* 11.8*  HCT 42.7 36.3* 35.5* 35.9*  MCV 85.4 87.5 86.8 86.1  PLT 143* 131* 130* 144*   Basic Metabolic Panel: Recent Labs  Lab 10/10/22 1012 10/11/22 0427 10/12/22 0354 10/13/22 0524  NA 136 136 137 136  K 3.9 4.2 3.9 3.6  CL 102 105 107 109  CO2 24 23 23 22   GLUCOSE 120* 108* 90 95  BUN 18 21 18 15   CREATININE 1.72* 1.42* 1.34* 1.26*  CALCIUM 8.8* 7.8* 8.1* 8.0*   Liver Function Tests: Recent Labs  Lab 10/10/22 1012  AST 16  ALT 17  ALKPHOS 43  BILITOT 1.3*  PROT 6.9  ALBUMIN 3.5   CBG: No results for  input(s): "GLUCAP" in the last 168 hours.  Discharge time spent: greater than 30 minutes.  Signed: Coralie Keens, MD [facility] 10/13/2022

## 2022-10-13 NOTE — TOC Transition Note (Signed)
Transition of Care [facility]) - CM/SW Discharge Note   Patient Details  Name: Barry Powers MRN: [id number] Date of Birth: 12-21-1945  Transition of Care [facility]) CM/SW Contact:  Elliot Gault, LCSW Phone Number: 10/13/2022, 11:43 AM   Clinical Narrative:     Pt medically stable for dc per MD. PT recommending HHPT. Spoke with pt at bedside to review dc plan. Pt states he will return home with his wife. Offered HHPT referral and pt stated that he did not think he needed it. He stated that he could walk around on his own and that his wife is ordering him a stationary pedal machine.   No other TOC needs identified.  Expected Discharge Plan: Home/Self Care Barriers to Discharge: Barriers Resolved   Patient Goals and CMS Choice Patient states their goals for this hospitalization and ongoing recovery are:: go home      Expected Discharge Plan and Services Expected Discharge Plan: Home/Self Care In-house Referral: Clinical Social Work     Living arrangements for the past 2 months: Single Family Home                           HH Arranged: Refused HH          Prior Living Arrangements/Services Living arrangements for the past 2 months: Single Family Home Lives with:: Spouse Patient language and need for interpreter reviewed:: Yes Do you feel safe going back to the place where you live?: Yes      Need for Family Participation in Patient Care: No (Comment) Care giver support system in place?: Yes (comment)   Criminal Activity/Legal Involvement Pertinent to Current Situation/Hospitalization: No - Comment as needed  Activities of Daily Living Home Assistive Devices/Equipment: Wheelchair, Environmental consultant (specify type) ADL Screening (condition at time of admission) Patient's cognitive ability adequate to safely complete daily activities?: Yes Is the patient deaf or have difficulty hearing?: Yes Does the patient have difficulty seeing, even when wearing glasses/contacts?: No Does the  patient have difficulty concentrating, remembering, or making decisions?: No Patient able to express need for assistance with ADLs?: Yes Does the patient have difficulty dressing or bathing?: No Independently performs ADLs?: No Communication: Independent Dressing (OT): Independent Grooming: Independent Feeding: Independent Bathing: Independent Toileting: Needs assistance Is this a change from baseline?: Change from baseline, expected to last <3 days In/Out Bed: Needs assistance Is this a change from baseline?: Change from baseline, expected to last <3 days Walks in Home: Needs assistance Is this a change from baseline?: Change from baseline, expected to last <3 days Does the patient have difficulty walking or climbing stairs?: No Weakness of Legs: Both Weakness of Arms/Hands: None  Permission Sought/Granted                  Emotional Assessment   Attitude/Demeanor/Rapport: Engaged Affect (typically observed): Pleasant Orientation: : Oriented to Self, Oriented to Place, Oriented to  Time, Oriented to Situation Alcohol / Substance Use: Not Applicable Psych Involvement: No (comment)  Admission diagnosis:  Epididymo-orchitis [N45.3] Orchitis, epididymitis, and epididymo-orchitis [N45.2, N45.1, N45.3] Sepsis without acute organ dysfunction, due to unspecified organism (HCC) [A41.9] Orchitis and epididymitis [N45.3] Patient Active Problem List   Diagnosis Date Noted   AKI (acute kidney injury) (HCC) 10/11/2022   Orchitis and epididymitis 10/11/2022   Orchitis, epididymitis, and epididymo-orchitis 10/10/2022   Benign prostatic hyperplasia with urinary obstruction 07/24/2022   BPH (benign prostatic hyperplasia) 07/24/2022   Chronic fatigue 06/12/2022   Viral pneumonia 03/30/2022  Pneumonia due to respiratory syncytial virus (RSV) 03/29/2022   UTI (urinary tract infection) 03/29/2022   Lower urinary tract symptoms (LUTS) 02/28/2022   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HCC)  02/28/2022   Coronary artery 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 01/31/2022   HLD (hyperlipidemia) 01/31/2022   Hypertension    PCP:  Gilmore Laroche, FNP Pharmacy:   [id number] - [place], [place] - [address] [place] [address] Phone: [phone] Fax: [phone]     Social Determinants of Health (SDOH) Interventions    Readmission Risk Interventions     No data to display           Final next level of care: Home/Self Care Barriers to Discharge: Barriers Resolved   Patient Goals and CMS Choice      Discharge Placement                         Discharge Plan and Services Additional resources added to the After Visit Summary for   In-house Referral: Clinical Social Work                        HH Arranged: Refused HH          Social Determinants of Health (SDOH) Interventions SDOH Screenings   Food Insecurity: No Food Insecurity (10/10/2022)  Housing: Low Risk  (10/10/2022)  Transportation Needs: No Transportation Needs (10/10/2022)  Utilities: Not At Risk (10/10/2022)  Depression (PHQ2-9): High Risk (06/12/2022)  Tobacco Use: Low Risk  (10/10/2022)     Readmission Risk Interventions     No data to display

## 2022-10-13 NOTE — Care Management Important Message (Signed)
Important Message  Patient Details  Name: Barry Powers MRN: [id number] Date of Birth: 1946/04/08   Medicare Important Message Given:  N/A - LOS <3 / Initial given by admissions     Corey Harold 10/13/2022, 12:54 PM

## 2022-10-14 LAB — CULTURE, BLOOD (ROUTINE X 2)
Culture: NO GROWTH
Special Requests: ADEQUATE

## 2022-10-15 LAB — CULTURE, BLOOD (ROUTINE X 2)

## 2022-10-16 ENCOUNTER — Telehealth: Payer: Self-pay

## 2022-10-16 NOTE — Transitions of Care (Post Inpatient/ED Visit) (Signed)
10/16/2022  Name: Barry Powers MRN: [id number] DOB: 02-21-1946  Today's TOC FU Call Status: Today's TOC FU Call Status:: Successful TOC FU Call Competed TOC FU Call Complete Date: 10/16/22  Transition Care Management Follow-up Telephone Call Date of Discharge: 10/13/22 [facility] Type of Discharge: Inpatient Admission Primary Inpatient Discharge Diagnosis:: sepsis How have you been since you were released from the hospital?: Better Any questions or concerns?: No  Items Reviewed: Did you receive and understand the discharge instructions provided?: Yes Medications obtained,verified, and reconciled?: Yes (Medications Reviewed) Any new allergies since your discharge?: No Dietary orders reviewed?: Yes Do you have support at home?: Yes People in Home: spouse  Medications Reviewed Today: Medications Reviewed Today     Reviewed by Karena Addison, LPN (Licensed Practical Nurse) on 10/16/22 at 1222  Med List Status: <None>   Medication Order Taking? Sig Documenting Provider Last Dose Status Informant  acetaminophen (TYLENOL) 325 MG tablet [id number]  Take 2 tablets (650 mg total) by mouth every 6 (six) hours as needed for mild pain or moderate pain (or Fever >/= 101). Arrien, York Ram, MD  Active   albuterol (VENTOLIN HFA) 108 (90 Base) MCG/ACT inhaler [id number] No Inhale 2 puffs into the lungs every 6 (six) hours as needed for wheezing or shortness of breath. Erick Blinks, MD [facility] Active Spouse/Significant Other           Med Note Hart Rochester, Lafe Garin   Wed Jul 19, 2022  9:49 AM) Has on hand but, has never used  aspirin EC 81 MG tablet [id number] No Take 81 mg by mouth daily. [provider] 10/09/2022 Active Spouse/Significant Other  levofloxacin (LEVAQUIN) 500 MG tablet [id number]  Take 1 tablet (500 mg total) by mouth daily for 11 days. Arrien, York Ram, MD  Active   metoprolol succinate (TOPROL-XL) 25 MG 24 hr tablet [id number] No Take 1  tablet (25 mg total) by mouth daily. Mallipeddi, Orion Modest, MD 10/09/2022 2000 Active Spouse/Significant Other  mirabegron ER (MYRBETRIQ) 25 MG TB24 tablet [id number] No Take 1 tablet (25 mg total) by mouth daily. Malen Gauze, MD 10/09/2022 Active Spouse/Significant Other  nitroGLYCERIN (NITROSTAT) 0.4 MG SL tablet [id number] No Place 1 tablet (0.4 mg total) under the tongue every 5 (five) minutes as needed for chest pain. Wendall Stade, MD [facility] Active Spouse/Significant Other  rosuvastatin (CRESTOR) 40 MG tablet [id number] No Take 1 tablet (40 mg total) by mouth daily. Mallipeddi, Orion Modest, MD 10/09/2022 Active Spouse/Significant Other           Med Note Hart Rochester, BROOKE C   Wed Jul 19, 2022  9:48 AM)              Home Care and Equipment/Supplies: Were Home Health Services Ordered?: NA Any new equipment or medical supplies ordered?: NA  Functional Questionnaire: Do you need assistance with bathing/showering or dressing?: No Do you need assistance with meal preparation?: No Do you need assistance with eating?: No Do you have difficulty maintaining continence: No Do you need assistance with getting out of bed/getting out of a chair/moving?: No Do you have difficulty managing or taking your medications?: No  Follow up appointments reviewed: PCP Follow-up appointment confirmed?: No (no avail appt, sent message staff to schedule) Specialist Hospital Follow-up appointment confirmed?: Yes Date of Specialist follow-up appointment?: 11/03/22 Follow-Up Specialty Provider:: uro Do you need transportation to your follow-up appointment?: No Do you understand care options if your condition(s) worsen?: Yes-patient verbalized understanding  SIGNATURE Karena Addison, LPN [facility] Nurse Health Advisor Direct Dial [phone]

## 2022-10-24 ENCOUNTER — Ambulatory Visit (INDEPENDENT_AMBULATORY_CARE_PROVIDER_SITE_OTHER): Payer: Medicare Other | Admitting: Family Medicine

## 2022-10-24 ENCOUNTER — Encounter: Payer: Self-pay | Admitting: Family Medicine

## 2022-10-24 VITALS — BP 133/72 | HR 69 | Ht 69.0 in | Wt 171.0 lb

## 2022-10-24 DIAGNOSIS — G479 Sleep disorder, unspecified: Secondary | ICD-10-CM

## 2022-10-24 DIAGNOSIS — N452 Orchitis: Secondary | ICD-10-CM | POA: Diagnosis not present

## 2022-10-24 DIAGNOSIS — N451 Epididymitis: Secondary | ICD-10-CM

## 2022-10-24 DIAGNOSIS — G114 Hereditary spastic paraplegia: Secondary | ICD-10-CM

## 2022-10-24 DIAGNOSIS — N453 Epididymo-orchitis: Secondary | ICD-10-CM | POA: Diagnosis not present

## 2022-10-24 MED ORDER — HYDROXYZINE PAMOATE 25 MG PO CAPS
25.0000 mg | ORAL_CAPSULE | Freq: Every evening | ORAL | 0 refills | Status: AC | PRN
Start: 2022-10-24 — End: ?

## 2022-10-24 NOTE — Assessment & Plan Note (Signed)
Encourage to take hydroxyzine 25 mg nightly as needed

## 2022-10-24 NOTE — Assessment & Plan Note (Signed)
Encouraged to complete his full course of antibiotic Encouraged to increase his fluid intake Labs and imaging revealed We will reassess his CBC and BMP No systemic symptoms reported Encouraged to increase his fluid intake to at least 64 ounces daily Encouraged to follow-up with urology on 11/03/2022

## 2022-10-24 NOTE — Progress Notes (Signed)
Established Patient Office Visit  Subjective:  Patient ID: Barry Powers, male    DOB: 11-29-1945  Age: 77 y.o. MRN: [id number]  CC:  Chief Complaint  Patient presents with   Follow-up    Hospital follow up, d/c on 10/13/2022, pt reports feeling weak, legs giving out.    Insomnia    Pt reports thinking a lot at night and having trouble sleeping.     HPI Barry Powers is a 76 y.o. male with past medical history of  PH, coronary artery disease, hypertension and dyslipidemia and hereditary paraplegia presents for f/u of  chronic medical conditions. For the details of today's visit, please refer to the assessment and plan.     Orchitis: was seen in the ED on 10/10/2022 with c/o of right right sided testicular pain, edema and erythema.  The patient was placed on IV antibiotics along with supportive medical therapy.  He was discharged home with a 11-day course of levofloxacin 500 mg daily and encouraged to follow-up with urology.  He has an appointment with urology on 11/03/2022.  He reports feeling well today.  No fever or chills reported.  No scrotal tenderness swelling or pain noted.  He reports having 2 to 3 days left of his antibiotic.  No urinary symptoms reported.   Past Medical History:  Diagnosis Date   Coronary artery disease    Hypercholesteremia    Hypertension    NSTEMI (non-ST elevated myocardial infarction) [facility])     Past Surgical History:  Procedure Laterality Date   CERVICAL LAMINECTOMY     CYSTOSCOPY N/A 07/24/2022   Procedure: CYSTOSCOPY;  Surgeon: Malen Gauze, MD;  Location: [facility];  Service: Urology;  Laterality: N/A;   LUMBAR SPINE SURGERY     TRANSURETHRAL RESECTION OF PROSTATE N/A 07/24/2022   Procedure: TRANSURETHRAL RESECTION OF THE PROSTATE (TURP);  Surgeon: Malen Gauze, MD;  Location: [facility];  Service: Urology;  Laterality: N/A;    Family History  Problem Relation Age of Onset   Alzheimer's disease Mother    Aneurysm Father    COPD  Sister     Social History   Socioeconomic History   Marital status: Married    Spouse name: Not on file   Number of children: Not on file   Years of education: Not on file   Highest education level: Not on file  Occupational History   Not on file  Tobacco Use   Smoking status: Never   Smokeless tobacco: Never  Vaping Use   Vaping status: Never Used  Substance and Sexual Activity   Alcohol use: No   Drug use: No   Sexual activity: Not Currently  Other Topics Concern   Not on file  Social History Narrative   Not on file   Social Determinants of Health   Financial Resource Strain: Not on file  Food Insecurity: No Food Insecurity (10/10/2022)   Hunger Vital Sign    Worried About Running Out of Food in the Last Year: Never true    Ran Out of Food in the Last Year: Never true  Transportation Needs: No Transportation Needs (10/10/2022)   PRAPARE - Administrator, Civil Service (Medical): No    Lack of Transportation (Non-Medical): No  Physical Activity: Not on file  Stress: Not on file  Social Connections: Not on file  Intimate Partner Violence: Not At Risk (10/10/2022)   Humiliation, Afraid, Rape, and Kick questionnaire    Fear of Current or  Ex-Partner: No    Emotionally Abused: No    Physically Abused: No    Sexually Abused: No    Outpatient Medications Prior to Visit  Medication Sig Dispense Refill   acetaminophen (TYLENOL) 325 MG tablet Take 2 tablets (650 mg total) by mouth every 6 (six) hours as needed for mild pain or moderate pain (or Fever >/= 101).     albuterol (VENTOLIN HFA) 108 (90 Base) MCG/ACT inhaler Inhale 2 puffs into the lungs every 6 (six) hours as needed for wheezing or shortness of breath. 8 g 2   aspirin EC 81 MG tablet Take 81 mg by mouth daily.     levofloxacin (LEVAQUIN) 500 MG tablet Take 1 tablet (500 mg total) by mouth daily for 11 days. 11 tablet 0   metoprolol succinate (TOPROL-XL) 25 MG 24 hr tablet Take 1 tablet (25 mg total) by  mouth daily. 90 tablet 1   mirabegron ER (MYRBETRIQ) 25 MG TB24 tablet Take 1 tablet (25 mg total) by mouth daily. 30 tablet 0   nitroGLYCERIN (NITROSTAT) 0.4 MG SL tablet Place 1 tablet (0.4 mg total) under the tongue every 5 (five) minutes as needed for chest pain. 25 tablet 3   rosuvastatin (CRESTOR) 40 MG tablet Take 1 tablet (40 mg total) by mouth daily. 90 tablet 3   No facility-administered medications prior to visit.    Allergies  Allergen Reactions   Baclofen Other (See Comments)    Bad dreams    Penicillins Other (See Comments)    "Blacked-out"    ROS Review of Systems  Constitutional:  Negative for fatigue and fever.  Eyes:  Negative for visual disturbance.  Respiratory:  Negative for chest tightness and shortness of breath.   Cardiovascular:  Negative for chest pain and palpitations.  Neurological:  Negative for dizziness and headaches.      Objective:    Physical Exam HENT:     Head: Normocephalic.     Right Ear: External ear normal.     Left Ear: External ear normal.     Nose: No congestion or rhinorrhea.     Mouth/Throat:     Mouth: Mucous membranes are moist.  Cardiovascular:     Rate and Rhythm: Regular rhythm.     Heart sounds: No murmur heard. Pulmonary:     Effort: No respiratory distress.     Breath sounds: Normal breath sounds.  Neurological:     Mental Status: He is alert.     Motor: Weakness present. No atrophy.     Comments:  Bilateral Patellar reflex-0     BP 133/72   Pulse 69   Ht 5\' 9"  (1.753 m)   Wt 171 lb 0.6 oz (77.6 kg)   SpO2 93%   BMI 25.26 kg/m  Wt Readings from Last 3 Encounters:  10/24/22 171 lb 0.6 oz (77.6 kg)  10/10/22 170 lb 3.1 oz (77.2 kg)  07/24/22 171 lb 15.3 oz (78 kg)    No results found for: "TSH" Lab Results  Component Value Date   WBC 8.5 10/13/2022   HGB 11.8 (L) 10/13/2022   HCT 35.9 (L) 10/13/2022   MCV 86.1 10/13/2022   PLT 144 (L) 10/13/2022   Lab Results  Component Value Date   NA 136  10/13/2022   K 3.6 10/13/2022   CO2 22 10/13/2022   GLUCOSE 95 10/13/2022   BUN 15 10/13/2022   CREATININE 1.26 (H) 10/13/2022   BILITOT 1.3 (H) 10/10/2022   ALKPHOS 43 10/10/2022  AST 16 10/10/2022   ALT 17 10/10/2022   PROT 6.9 10/10/2022   ALBUMIN 3.5 10/10/2022   CALCIUM 8.0 (L) 10/13/2022   ANIONGAP 5 10/13/2022   EGFR 51 (L) 05/30/2022   Lab Results  Component Value Date   CHOL 266 (H) 01/31/2022   Lab Results  Component Value Date   HDL 37 (L) 01/31/2022   Lab Results  Component Value Date   LDLCALC 176 (H) 01/31/2022   Lab Results  Component Value Date   TRIG 263 (H) 01/31/2022   Lab Results  Component Value Date   CHOLHDL 7.2 01/31/2022   No results found for: "HGBA1C"    Assessment & Plan:  Orchitis, epididymitis, and epididymo-orchitis Assessment & Plan: Encouraged to complete his full course of antibiotic Encouraged to increase his fluid intake Labs and imaging revealed We will reassess his CBC and BMP No systemic symptoms reported Encouraged to increase his fluid intake to at least 64 ounces daily Encouraged to follow-up with urology on 11/03/2022   Orchitis -     CBC -     BMP8+EGFR  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HCC) Assessment & Plan: Chronic condition Has not follow-up with neurology since moving to [place] Reports paralysis in his lower extremities Patellar reflexes are 0 bilaterally The patient was unable to ambulate in the clinic due to weakness in his lower extremities Referral placed to neurology for further evaluation and care Declines physical therapy   Orders: -     Ambulatory referral to Neurology  Sleep disturbance Assessment & Plan: Encourage to take hydroxyzine 25 mg nightly as needed  Orders: -     hydrOXYzine Pamoate; Take 1 capsule (25 mg total) by mouth at bedtime as needed.  Dispense: 30 capsule; Refill: 0  Note: This chart has been completed using Engineer, civil (consulting) software, and while attempts  have been made to ensure accuracy, certain words and phrases may not be transcribed as intended.    Follow-up: Return in about 3 months (around 01/24/2023).   Gilmore Laroche, FNP

## 2022-10-24 NOTE — Patient Instructions (Addendum)
I appreciate the opportunity to provide care to you today!    Follow up:  3 months  Labs: please stop by the lab today to get your blood drawn (CBC and BMP)  Please complete the full course of antibiotic and follow up with urology as scheduled on 11/03/2022 I recommend increasing your fluid intake at least 64 ounces daily  Insomnia Start taking hydroxyzine 25 mg at bedtime  Please make an appointment or reports to the ED for if you have these symptoms Fever. Chills or feeling very cold. Confusion or anxiety. Fatigue. Muscle aches. Shortness of breath or rapid breathing (hyperventilation). Nausea and vomiting. Urinating much less than usual. Fast heart rate. Changes in skin color. Your skin may look blotchy, pale, or blue. Cool, clammy, or sweaty skin. Skin rash.  Referrals today- Neurology    Please continue to a heart-healthy diet and increase your physical activities. Try to exercise for at least five days a week.    It was a pleasure to see you and I look forward to continuing to work together on your health and well-being. Please do not hesitate to call the office if you need care or have questions about your care.  In case of emergency, please visit the Emergency Department for urgent care, or contact our clinic at [phone] to schedule an appointment. We're here to help you!   Have a wonderful day and week. With Gratitude, Gilmore Laroche MSN, FNP-BC

## 2022-10-24 NOTE — Assessment & Plan Note (Signed)
Chronic condition Has not follow-up with neurology since moving to [place] Reports paralysis in his lower extremities Patellar reflexes are 0 bilaterally The patient was unable to ambulate in the clinic due to weakness in his lower extremities Referral placed to neurology for further evaluation and care Declines physical therapy

## 2022-10-25 DIAGNOSIS — N452 Orchitis: Secondary | ICD-10-CM | POA: Diagnosis not present

## 2022-10-26 LAB — BMP8+EGFR
BUN/Creatinine Ratio: 7 — ABNORMAL LOW (ref 10–24)
BUN: 10 mg/dL (ref 8–27)
CO2: 24 mmol/L (ref 20–29)
Calcium: 9.5 mg/dL (ref 8.6–10.2)
Chloride: 105 mmol/L (ref 96–106)
Creatinine, Ser: 1.51 mg/dL — ABNORMAL HIGH (ref 0.76–1.27)
Glucose: 98 mg/dL (ref 70–99)
Potassium: 4.5 mmol/L (ref 3.5–5.2)
Sodium: 142 mmol/L (ref 134–144)
eGFR: 48 mL/min/{1.73_m2} — ABNORMAL LOW (ref >59)

## 2022-10-26 LAB — CBC
Hematocrit: 46 % (ref 37.5–51.0)
Hemoglobin: 14.1 g/dL (ref 13.0–17.7)
MCH: 27.2 pg (ref 26.6–33.0)
MCHC: 30.7 g/dL — ABNORMAL LOW (ref 31.5–35.7)
MCV: 89 fL (ref 79–97)
Platelets: 285 10*3/uL (ref 150–450)
RBC: 5.18 x10E6/uL (ref 4.14–5.80)
RDW: 15 % (ref 11.6–15.4)
WBC: 7.6 10*3/uL (ref 3.4–10.8)

## 2022-10-27 ENCOUNTER — Telehealth: Payer: Self-pay | Admitting: Family Medicine

## 2022-10-27 NOTE — Progress Notes (Signed)
Please inform the patient to increase his fluid intake, at least 64 ounces daily as his labs indicate poor fluid intake.  All other labs are stable

## 2022-10-30 NOTE — Telephone Encounter (Signed)
Spoke to pt he asked for me to speak to his wife, his wife stated Referral is not needed to please cancel it .

## 2022-11-03 ENCOUNTER — Ambulatory Visit: Payer: Medicare Other | Admitting: Urology

## 2022-11-03 VITALS — BP 126/69 | HR 61 | Ht 69.0 in | Wt 171.0 lb

## 2022-11-03 DIAGNOSIS — R339 Retention of urine, unspecified: Secondary | ICD-10-CM | POA: Diagnosis not present

## 2022-11-03 DIAGNOSIS — N401 Enlarged prostate with lower urinary tract symptoms: Secondary | ICD-10-CM | POA: Diagnosis not present

## 2022-11-03 DIAGNOSIS — N138 Other obstructive and reflux uropathy: Secondary | ICD-10-CM

## 2022-11-03 DIAGNOSIS — R3915 Urgency of urination: Secondary | ICD-10-CM

## 2022-11-03 LAB — BLADDER SCAN AMB NON-IMAGING: Scan Result: 92

## 2022-11-03 MED ORDER — MIRABEGRON ER 25 MG PO TB24
25.0000 mg | ORAL_TABLET | Freq: Every day | ORAL | 11 refills | Status: DC
Start: 2022-11-03 — End: 2023-01-24

## 2022-11-03 NOTE — Progress Notes (Signed)
11/03/2022 12:40 PM   Barry Powers July 13, 1945 [id number]  Referring provider: Gilmore Laroche, FNP [address] [place],  [place] [address]  Followup urinary frequency urinary retention   HPI: Mr Barry Powers is a 77yo here for followup for BPH and urinary frequency. PVR 92cc. He has urinary urgency and frequency which is improved with mirabegron 25mg  daily. Urine stream strong. No straining to urinate. Nocturia 1-2x. No dysuria or hematuria   PMH: Past Medical History:  Diagnosis Date   Coronary artery disease    Hypercholesteremia    Hypertension    NSTEMI (non-ST elevated myocardial infarction) [facility])     Surgical History: Past Surgical History:  Procedure Laterality Date   CERVICAL LAMINECTOMY     CYSTOSCOPY N/A 07/24/2022   Procedure: CYSTOSCOPY;  Surgeon: Barry Gauze, MD;  Location: [facility];  Service: Urology;  Laterality: N/A;   LUMBAR SPINE SURGERY     TRANSURETHRAL RESECTION OF PROSTATE N/A 07/24/2022   Procedure: TRANSURETHRAL RESECTION OF THE PROSTATE (TURP);  Surgeon: Barry Gauze, MD;  Location: [facility];  Service: Urology;  Laterality: N/A;    Home Medications:  Allergies as of 11/03/2022       Reactions   Baclofen Other (See Comments)   Bad dreams    Penicillins Other (See Comments)   "Blacked-out"        Medication List        Accurate as of November 03, 2022 12:40 PM. If you have any questions, ask your nurse or doctor.          acetaminophen 325 MG tablet Commonly known as: TYLENOL Take 2 tablets (650 mg total) by mouth every 6 (six) hours as needed for mild pain or moderate pain (or Fever >/= 101).   albuterol 108 (90 Base) MCG/ACT inhaler Commonly known as: VENTOLIN HFA Inhale 2 puffs into the lungs every 6 (six) hours as needed for wheezing or shortness of breath.   aspirin EC 81 MG tablet Take 81 mg by mouth daily.   hydrOXYzine 25 MG capsule Commonly known as: VISTARIL Take 1 capsule (25 mg total) by mouth at bedtime  as needed.   metoprolol succinate 25 MG 24 hr tablet Commonly known as: TOPROL-XL Take 1 tablet (25 mg total) by mouth daily.   mirabegron ER 25 MG Tb24 tablet Commonly known as: MYRBETRIQ Take 1 tablet (25 mg total) by mouth daily.   nitroGLYCERIN 0.4 MG SL tablet Commonly known as: NITROSTAT Place 1 tablet (0.4 mg total) under the tongue every 5 (five) minutes as needed for chest pain.   rosuvastatin 40 MG tablet Commonly known as: CRESTOR Take 1 tablet (40 mg total) by mouth daily.        Allergies:  Allergies  Allergen Reactions   Baclofen Other (See Comments)    Bad dreams    Penicillins Other (See Comments)    "Blacked-out"    Family History: Family History  Problem Relation Age of Onset   Alzheimer's disease Mother    Aneurysm Father    COPD Sister     Social History:  reports that he has never smoked. He has never used smokeless tobacco. He reports that he does not drink alcohol and does not use drugs.  ROS: All other review of systems were reviewed and are negative except what is noted above in HPI  Physical Exam: BP 126/69   Pulse 61   Ht 5\' 9"  (1.753 m)   Wt 171 lb 0.6 oz (77.6 kg)  BMI 25.26 kg/m   Constitutional:  Alert and oriented, No acute distress. HEENT: [place] AT, moist mucus membranes.  Trachea midline, no masses. Cardiovascular: No clubbing, cyanosis, or edema. Respiratory: Normal respiratory effort, no increased work of breathing. GI: Abdomen is soft, nontender, nondistended, no abdominal masses GU: No CVA tenderness.  Lymph: No cervical or inguinal lymphadenopathy. Skin: No rashes, bruises or suspicious lesions. Neurologic: Grossly intact, no focal deficits, moving all 4 extremities. Psychiatric: Normal mood and affect.  Laboratory Data: Lab Results  Component Value Date   WBC 7.6 10/25/2022   HGB 14.1 10/25/2022   HCT 46.0 10/25/2022   MCV 89 10/25/2022   PLT 285 10/25/2022    Lab Results  Component Value Date   CREATININE  1.51 (H) 10/25/2022    No results found for: "PSA"  No results found for: "TESTOSTERONE"  No results found for: "HGBA1C"  Urinalysis    Component Value Date/Time   COLORURINE AMBER (A) 10/10/2022 1131   APPEARANCEUR TURBID (A) 10/10/2022 1131   APPEARANCEUR Clear 04/25/2022 1103   LABSPEC 1.029 10/10/2022 1131   PHURINE 5.0 10/10/2022 1131   GLUCOSEU NEGATIVE 10/10/2022 1131   HGBUR MODERATE (A) 10/10/2022 1131   BILIRUBINUR NEGATIVE 10/10/2022 1131   BILIRUBINUR Negative 04/25/2022 1103   KETONESUR 5 (A) 10/10/2022 1131   PROTEINUR 100 (A) 10/10/2022 1131   NITRITE NEGATIVE 10/10/2022 1131   LEUKOCYTESUR MODERATE (A) 10/10/2022 1131    Lab Results  Component Value Date   LABMICR See below: 04/25/2022   WBCUA 11-30 (A) 04/25/2022   LABEPIT 0-10 04/25/2022   BACTERIA RARE (A) 10/10/2022    Pertinent Imaging:  No results found for this or any previous visit.  No results found for this or any previous visit.  No results found for this or any previous visit.  No results found for this or any previous visit.  No results found for this or any previous visit.  No valid procedures specified. No results found for this or any previous visit.  No results found for this or any previous visit.   Assessment & Plan:    1. Urinary retention -resolved - BLADDER SCAN AMB NON-IMAGING  2. BPH with urinary obstruction -improved after TURP  3. Urinary urgency -Continue mirabegron   No follow-ups on file.  Barry Aye, MD  [facility] [place]

## 2022-11-03 NOTE — Progress Notes (Signed)
post void residual =75m

## 2022-11-07 ENCOUNTER — Telehealth: Payer: Self-pay

## 2022-11-07 ENCOUNTER — Encounter: Payer: Self-pay | Admitting: Urology

## 2022-11-07 NOTE — Telephone Encounter (Signed)
Generic is still to expensive out out of pocket. Patient is requesting alternative medication if available.

## 2022-11-07 NOTE — Patient Instructions (Signed)

## 2022-11-07 NOTE — Telephone Encounter (Signed)
Patient needing generic of  mirabegron ER (MYRBETRIQ) 25 MG TB24 tablet.  The out of pocket cost is too expensive.  [facility] is telling patient there is no generic.  Please advise.

## 2022-12-01 MED ORDER — TROSPIUM CHLORIDE ER 60 MG PO CP24: 60.0000 mg | ORAL_CAPSULE | Freq: Every day | ORAL | 11 refills | Status: DC

## 2022-12-01 NOTE — Telephone Encounter (Signed)
Patient called and made aware.

## 2023-01-24 ENCOUNTER — Ambulatory Visit (INDEPENDENT_AMBULATORY_CARE_PROVIDER_SITE_OTHER): Payer: Medicare Other | Admitting: Family Medicine

## 2023-01-24 ENCOUNTER — Encounter: Payer: Self-pay | Admitting: Family Medicine

## 2023-01-24 VITALS — BP 128/82 | HR 74 | Ht 69.0 in | Wt 165.0 lb

## 2023-01-24 DIAGNOSIS — E7849 Other hyperlipidemia: Secondary | ICD-10-CM | POA: Diagnosis not present

## 2023-01-24 DIAGNOSIS — I10 Essential (primary) hypertension: Secondary | ICD-10-CM | POA: Diagnosis not present

## 2023-01-24 DIAGNOSIS — R3915 Urgency of urination: Secondary | ICD-10-CM | POA: Diagnosis not present

## 2023-01-24 DIAGNOSIS — G114 Hereditary spastic paraplegia: Secondary | ICD-10-CM | POA: Diagnosis not present

## 2023-01-24 MED ORDER — TROSPIUM CHLORIDE ER 60 MG PO CP24
60.0000 mg | ORAL_CAPSULE | Freq: Every day | ORAL | 11 refills | Status: AC
Start: 2023-01-24 — End: ?

## 2023-01-24 MED ORDER — METOPROLOL SUCCINATE ER 25 MG PO TB24
25.0000 mg | ORAL_TABLET | Freq: Every day | ORAL | 1 refills | Status: DC
Start: 2023-01-24 — End: 2023-06-21

## 2023-01-24 NOTE — Patient Instructions (Addendum)
I appreciate the opportunity to provide care to you today!    Follow up:  4 months  Labs: please stop by the lab during the week  to get your blood drawn (CBC, CMP, TSH, Lipid profile, HgA1c, Vit D)     Please continue to a heart-healthy diet and increase your physical activities. Try to exercise for at least five days a week.    It was a pleasure to see you and I look forward to continuing to work together on your health and well-being. Please do not hesitate to call the office if you need care or have questions about your care.  In case of emergency, please visit the Emergency Department for urgent care, or contact our clinic at [phone] to schedule an appointment. We're here to help you!   Have a wonderful day and week. With Gratitude, Gilmore Laroche MSN, FNP-BC

## 2023-01-24 NOTE — Progress Notes (Unsigned)
Established Patient Office Visit  Subjective:  Patient ID: Barry Powers, male    DOB: 1946-03-17  Age: 77 y.o. MRN: [id number]  CC:  Chief Complaint  Patient presents with   Care Management    3 month f/u   Fall    Pt reports multiple falls most recent fall was Monday hurt his left elbow.     HPI Barry Powers is a 77 y.o. male with past medical history of *** presents for f/u of *** chronic medical conditions.  Past Medical History:  Diagnosis Date   Coronary artery disease    Hypercholesteremia    Hypertension    NSTEMI (non-ST elevated myocardial infarction) [facility])     Past Surgical History:  Procedure Laterality Date   CERVICAL LAMINECTOMY     CYSTOSCOPY N/A 07/24/2022   Procedure: CYSTOSCOPY;  Surgeon: Malen Gauze, MD;  Location: [facility];  Service: Urology;  Laterality: N/A;   LUMBAR SPINE SURGERY     TRANSURETHRAL RESECTION OF PROSTATE N/A 07/24/2022   Procedure: TRANSURETHRAL RESECTION OF THE PROSTATE (TURP);  Surgeon: Malen Gauze, MD;  Location: [facility];  Service: Urology;  Laterality: N/A;    Family History  Problem Relation Age of Onset   Alzheimer's disease Mother    Aneurysm Father    COPD Sister     Social History   Socioeconomic History   Marital status: Married    Spouse name: Not on file   Number of children: Not on file   Years of education: Not on file   Highest education level: Not on file  Occupational History   Not on file  Tobacco Use   Smoking status: Never   Smokeless tobacco: Never  Vaping Use   Vaping status: Never Used  Substance and Sexual Activity   Alcohol use: No   Drug use: No   Sexual activity: Not Currently  Other Topics Concern   Not on file  Social History Narrative   Not on file   Social Determinants of Health   Financial Resource Strain: Not on file  Food Insecurity: No Food Insecurity (10/10/2022)   Hunger Vital Sign    Worried About Running Out of Food in the Last Year: Never true    Ran  Out of Food in the Last Year: Never true  Transportation Needs: No Transportation Needs (10/10/2022)   PRAPARE - Administrator, Civil Service (Medical): No    Lack of Transportation (Non-Medical): No  Physical Activity: Not on file  Stress: Not on file  Social Connections: Not on file  Intimate Partner Violence: Not At Risk (10/10/2022)   Humiliation, Afraid, Rape, and Kick questionnaire    Fear of Current or Ex-Partner: No    Emotionally Abused: No    Physically Abused: No    Sexually Abused: No    Outpatient Medications Prior to Visit  Medication Sig Dispense Refill   acetaminophen (TYLENOL) 325 MG tablet Take 2 tablets (650 mg total) by mouth every 6 (six) hours as needed for mild pain or moderate pain (or Fever >/= 101).     albuterol (VENTOLIN HFA) 108 (90 Base) MCG/ACT inhaler Inhale 2 puffs into the lungs every 6 (six) hours as needed for wheezing or shortness of breath. 8 g 2   aspirin EC 81 MG tablet Take 81 mg by mouth daily.     hydrOXYzine (VISTARIL) 25 MG capsule Take 1 capsule (25 mg total) by mouth at bedtime as needed. 30 capsule 0  metoprolol succinate (TOPROL-XL) 25 MG 24 hr tablet Take 1 tablet (25 mg total) by mouth daily. 90 tablet 1   mirabegron ER (MYRBETRIQ) 25 MG TB24 tablet Take 1 tablet (25 mg total) by mouth daily. 30 tablet 11   nitroGLYCERIN (NITROSTAT) 0.4 MG SL tablet Place 1 tablet (0.4 mg total) under the tongue every 5 (five) minutes as needed for chest pain. 25 tablet 3   rosuvastatin (CRESTOR) 40 MG tablet Take 1 tablet (40 mg total) by mouth daily. 90 tablet 3   Trospium Chloride 60 MG CP24 Take 1 capsule (60 mg total) by mouth daily. 30 capsule 11   No facility-administered medications prior to visit.    Allergies  Allergen Reactions   Baclofen Other (See Comments)    Bad dreams    Penicillins Other (See Comments)    "Blacked-out"    ROS Review of Systems    Objective:    Physical Exam  BP 128/82 (BP Location: Left Arm)    Pulse 74   Ht 5\' 9"  (1.753 m)   Wt 165 lb (74.8 kg)   SpO2 96%   BMI 24.37 kg/m  Wt Readings from Last 3 Encounters:  01/24/23 165 lb (74.8 kg)  11/03/22 171 lb 0.6 oz (77.6 kg)  10/24/22 171 lb 0.6 oz (77.6 kg)    No results found for: "TSH" Lab Results  Component Value Date   WBC 7.6 10/25/2022   HGB 14.1 10/25/2022   HCT 46.0 10/25/2022   MCV 89 10/25/2022   PLT 285 10/25/2022   Lab Results  Component Value Date   NA 142 10/25/2022   K 4.5 10/25/2022   CO2 24 10/25/2022   GLUCOSE 98 10/25/2022   BUN 10 10/25/2022   CREATININE 1.51 (H) 10/25/2022   BILITOT 1.3 (H) 10/10/2022   ALKPHOS 43 10/10/2022   AST 16 10/10/2022   ALT 17 10/10/2022   PROT 6.9 10/10/2022   ALBUMIN 3.5 10/10/2022   CALCIUM 9.5 10/25/2022   ANIONGAP 5 10/13/2022   EGFR 48 (L) 10/25/2022   Lab Results  Component Value Date   CHOL 266 (H) 01/31/2022   Lab Results  Component Value Date   HDL 37 (L) 01/31/2022   Lab Results  Component Value Date   LDLCALC 176 (H) 01/31/2022   Lab Results  Component Value Date   TRIG 263 (H) 01/31/2022   Lab Results  Component Value Date   CHOLHDL 7.2 01/31/2022   No results found for: "HGBA1C"    Assessment & Plan:  There are no diagnoses linked to this encounter.  Follow-up: No follow-ups on file.   Barry Laroche, FNP

## 2023-01-25 ENCOUNTER — Ambulatory Visit: Payer: Medicare Other | Admitting: Internal Medicine

## 2023-01-25 DIAGNOSIS — R3915 Urgency of urination: Secondary | ICD-10-CM | POA: Insufficient documentation

## 2023-01-25 NOTE — Assessment & Plan Note (Signed)
Controlled on metoprolol 25 mg daily Asymptomatic in the clinic Reports treatment compliance Low-sodium diet with increased physical activity encouraged BP Readings from Last 3 Encounters:  01/24/23 128/82  11/03/22 126/69  10/24/22 133/72

## 2023-01-25 NOTE — Assessment & Plan Note (Signed)
Reports compliance on rosuvastatin 40 mg daily Encouraged to reduce his intake of greasy, fatty, starchy foods with increase physical activity Pending lipid panel Lab Results  Component Value Date   CHOL 266 (H) 01/31/2022   HDL 37 (L) 01/31/2022   LDLCALC 176 (H) 01/31/2022   TRIG 263 (H) 01/31/2022   CHOLHDL 7.2 01/31/2022

## 2023-01-25 NOTE — Assessment & Plan Note (Addendum)
The patient is under the care of urology. A refill for trospium chloride 60 mg daily will be provided, as he reports non-compliance with the treatment regimen. He notes that his symptoms are less frequent and well-controlled.

## 2023-01-25 NOTE — Progress Notes (Signed)
Established Patient Office Visit  Subjective:  Patient ID: Barry Powers, male    DOB: 1945-12-12  Age: 77 y.o. MRN: [id number]  CC:  Chief Complaint  Patient presents with   Care Management    3 month f/u   Fall    Pt reports multiple falls most recent fall was Monday hurt his left elbow.     HPI Barry Powers is a 77 y.o. male with past medical history of Hereditary spastic paraplegia, hyperlipidemia, and hypertension presents for f/u of  chronic medical conditions. For the details of today's visit, please refer to the assessment and plan.     Past Medical History:  Diagnosis Date   Coronary artery disease    Hypercholesteremia    Hypertension    NSTEMI (non-ST elevated myocardial infarction) [facility])     Past Surgical History:  Procedure Laterality Date   CERVICAL LAMINECTOMY     CYSTOSCOPY N/A 07/24/2022   Procedure: CYSTOSCOPY;  Surgeon: Malen Gauze, MD;  Location: [facility];  Service: Urology;  Laterality: N/A;   LUMBAR SPINE SURGERY     TRANSURETHRAL RESECTION OF PROSTATE N/A 07/24/2022   Procedure: TRANSURETHRAL RESECTION OF THE PROSTATE (TURP);  Surgeon: Malen Gauze, MD;  Location: [facility];  Service: Urology;  Laterality: N/A;    Family History  Problem Relation Age of Onset   Alzheimer's disease Mother    Aneurysm Father    COPD Sister     Social History   Socioeconomic History   Marital status: Married    Spouse name: Not on file   Number of children: Not on file   Years of education: Not on file   Highest education level: Not on file  Occupational History   Not on file  Tobacco Use   Smoking status: Never   Smokeless tobacco: Never  Vaping Use   Vaping status: Never Used  Substance and Sexual Activity   Alcohol use: No   Drug use: No   Sexual activity: Not Currently  Other Topics Concern   Not on file  Social History Narrative   Not on file   Social Determinants of Health   Financial Resource Strain: Not on file  Food  Insecurity: No Food Insecurity (10/10/2022)   Hunger Vital Sign    Worried About Running Out of Food in the Last Year: Never true    Ran Out of Food in the Last Year: Never true  Transportation Needs: No Transportation Needs (10/10/2022)   PRAPARE - Administrator, Civil Service (Medical): No    Lack of Transportation (Non-Medical): No  Physical Activity: Not on file  Stress: Not on file  Social Connections: Not on file  Intimate Partner Violence: Not At Risk (10/10/2022)   Humiliation, Afraid, Rape, and Kick questionnaire    Fear of Current or Ex-Partner: No    Emotionally Abused: No    Physically Abused: No    Sexually Abused: No    Outpatient Medications Prior to Visit  Medication Sig Dispense Refill   acetaminophen (TYLENOL) 325 MG tablet Take 2 tablets (650 mg total) by mouth every 6 (six) hours as needed for mild pain or moderate pain (or Fever >/= 101).     albuterol (VENTOLIN HFA) 108 (90 Base) MCG/ACT inhaler Inhale 2 puffs into the lungs every 6 (six) hours as needed for wheezing or shortness of breath. 8 g 2   aspirin EC 81 MG tablet Take 81 mg by mouth daily.  hydrOXYzine (VISTARIL) 25 MG capsule Take 1 capsule (25 mg total) by mouth at bedtime as needed. 30 capsule 0   nitroGLYCERIN (NITROSTAT) 0.4 MG SL tablet Place 1 tablet (0.4 mg total) under the tongue every 5 (five) minutes as needed for chest pain. 25 tablet 3   rosuvastatin (CRESTOR) 40 MG tablet Take 1 tablet (40 mg total) by mouth daily. 90 tablet 3   metoprolol succinate (TOPROL-XL) 25 MG 24 hr tablet Take 1 tablet (25 mg total) by mouth daily. 90 tablet 1   mirabegron ER (MYRBETRIQ) 25 MG TB24 tablet Take 1 tablet (25 mg total) by mouth daily. 30 tablet 11   Trospium Chloride 60 MG CP24 Take 1 capsule (60 mg total) by mouth daily. 30 capsule 11   No facility-administered medications prior to visit.    Allergies  Allergen Reactions   Baclofen Other (See Comments)    Bad dreams    Penicillins Other  (See Comments)    "Blacked-out"    ROS Review of Systems  Constitutional:  Negative for fatigue and fever.  Eyes:  Negative for visual disturbance.  Respiratory:  Negative for chest tightness and shortness of breath.   Cardiovascular:  Negative for chest pain and palpitations.  Neurological:  Negative for dizziness and headaches.      Objective:    Physical Exam HENT:     Head: Normocephalic.     Right Ear: External ear normal.     Left Ear: External ear normal.     Nose: No congestion or rhinorrhea.     Mouth/Throat:     Mouth: Mucous membranes are moist.  Cardiovascular:     Rate and Rhythm: Regular rhythm.     Heart sounds: No murmur heard. Pulmonary:     Effort: No respiratory distress.     Breath sounds: Normal breath sounds.  Musculoskeletal:     Comments: Range of motion of the elbow is intact with passive and active range of motion bilaterally  Neurological:     Mental Status: He is alert.     BP 128/82 (BP Location: Left Arm)   Pulse 74   Ht 5\' 9"  (1.753 m)   Wt 165 lb (74.8 kg)   SpO2 96%   BMI 24.37 kg/m  Wt Readings from Last 3 Encounters:  01/24/23 165 lb (74.8 kg)  11/03/22 171 lb 0.6 oz (77.6 kg)  10/24/22 171 lb 0.6 oz (77.6 kg)    No results found for: "TSH" Lab Results  Component Value Date   WBC 7.6 10/25/2022   HGB 14.1 10/25/2022   HCT 46.0 10/25/2022   MCV 89 10/25/2022   PLT 285 10/25/2022   Lab Results  Component Value Date   NA 142 10/25/2022   K 4.5 10/25/2022   CO2 24 10/25/2022   GLUCOSE 98 10/25/2022   BUN 10 10/25/2022   CREATININE 1.51 (H) 10/25/2022   BILITOT 1.3 (H) 10/10/2022   ALKPHOS 43 10/10/2022   AST 16 10/10/2022   ALT 17 10/10/2022   PROT 6.9 10/10/2022   ALBUMIN 3.5 10/10/2022   CALCIUM 9.5 10/25/2022   ANIONGAP 5 10/13/2022   EGFR 48 (L) 10/25/2022   Lab Results  Component Value Date   CHOL 266 (H) 01/31/2022   Lab Results  Component Value Date   HDL 37 (L) 01/31/2022   Lab Results   Component Value Date   LDLCALC 176 (H) 01/31/2022   Lab Results  Component Value Date   TRIG 263 (H) 01/31/2022  Lab Results  Component Value Date   CHOLHDL 7.2 01/31/2022   No results found for: "HGBA1C"    Assessment & Plan:  Primary hypertension Assessment & Plan: Controlled on metoprolol 25 mg daily Asymptomatic in the clinic Reports treatment compliance Low-sodium diet with increased physical activity encouraged BP Readings from Last 3 Encounters:  01/24/23 128/82  11/03/22 126/69  10/24/22 133/72     Orders: -     Metoprolol Succinate ER; Take 1 tablet (25 mg total) by mouth daily.  Dispense: 90 tablet; Refill: 1  Urinary urgency Assessment & Plan: The patient is under the care of urology. A refill for trospium chloride 60 mg daily will be provided, as he reports non-compliance with the treatment regimen. He notes that his symptoms are less frequent and well-controlled.    Orders: -     Trospium Chloride ER; Take 1 capsule (60 mg total) by mouth daily.  Dispense: 30 capsule; Refill: 11  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HCC) Assessment & Plan: The patient declines referrals to physical therapy, occupational therapy, and neurology. His frequent falls are likely due to hereditary spastic paraplegia. He was encouraged to perform home exercises for his lower extremities to strengthen his leg muscles. The patient also declines pharmacological therapy with a muscle relaxant today, noting that the medication provides minimal relief. He was advised to continue home exercises for his lower extremities, use assistive devices, and clear clutter from his pathway to prevent falls. The range of motion in both elbows is intact, with no numbness, tingling, or stiffness reported. Flexion and extension of both elbows are normal, and no deformities were observed.     Other hyperlipidemia Assessment & Plan: Reports compliance on rosuvastatin 40 mg daily Encouraged to reduce his  intake of greasy, fatty, starchy foods with increase physical activity Pending lipid panel Lab Results  Component Value Date   CHOL 266 (H) 01/31/2022   HDL 37 (L) 01/31/2022   LDLCALC 176 (H) 01/31/2022   TRIG 263 (H) 01/31/2022   CHOLHDL 7.2 01/31/2022      Note: This chart has been completed using [employer] software, and while attempts have been made to ensure accuracy, certain words and phrases may not be transcribed as intended.    Follow-up: Return in about 4 months (around 05/27/2023).   Gilmore Laroche, FNP

## 2023-01-25 NOTE — Assessment & Plan Note (Signed)
The patient declines referrals to physical therapy, occupational therapy, and neurology. His frequent falls are likely due to hereditary spastic paraplegia. He was encouraged to perform home exercises for his lower extremities to strengthen his leg muscles. The patient also declines pharmacological therapy with a muscle relaxant today, noting that the medication provides minimal relief. He was advised to continue home exercises for his lower extremities, use assistive devices, and clear clutter from his pathway to prevent falls. The range of motion in both elbows is intact, with no numbness, tingling, or stiffness reported. Flexion and extension of both elbows are normal, and no deformities were observed.

## 2023-04-24 ENCOUNTER — Ambulatory Visit: Payer: Medicare Other | Admitting: Urology

## 2023-04-25 ENCOUNTER — Other Ambulatory Visit: Payer: Self-pay | Admitting: Internal Medicine

## 2023-04-26 ENCOUNTER — Other Ambulatory Visit: Payer: Self-pay | Admitting: Internal Medicine

## 2023-05-04 DIAGNOSIS — J101 Influenza due to other identified influenza virus with other respiratory manifestations: Secondary | ICD-10-CM | POA: Diagnosis not present

## 2023-05-04 DIAGNOSIS — R531 Weakness: Secondary | ICD-10-CM | POA: Diagnosis not present

## 2023-05-04 DIAGNOSIS — I10 Essential (primary) hypertension: Secondary | ICD-10-CM | POA: Diagnosis not present

## 2023-05-04 DIAGNOSIS — E860 Dehydration: Secondary | ICD-10-CM | POA: Diagnosis not present

## 2023-05-04 DIAGNOSIS — I959 Hypotension, unspecified: Secondary | ICD-10-CM | POA: Diagnosis not present

## 2023-05-04 DIAGNOSIS — E785 Hyperlipidemia, unspecified: Secondary | ICD-10-CM | POA: Diagnosis not present

## 2023-05-04 DIAGNOSIS — Z20822 Contact with and (suspected) exposure to covid-19: Secondary | ICD-10-CM | POA: Diagnosis not present

## 2023-05-04 DIAGNOSIS — Z880 Allergy status to penicillin: Secondary | ICD-10-CM | POA: Diagnosis not present

## 2023-05-07 ENCOUNTER — Ambulatory Visit: Payer: Medicare Other | Admitting: Urology

## 2023-05-28 ENCOUNTER — Ambulatory Visit (INDEPENDENT_AMBULATORY_CARE_PROVIDER_SITE_OTHER): Payer: Medicare Other | Admitting: Family Medicine

## 2023-05-28 ENCOUNTER — Encounter: Payer: Self-pay | Admitting: Family Medicine

## 2023-05-28 VITALS — BP 116/69 | HR 57 | Ht 69.0 in | Wt 165.0 lb

## 2023-05-28 DIAGNOSIS — Z1159 Encounter for screening for other viral diseases: Secondary | ICD-10-CM | POA: Diagnosis not present

## 2023-05-28 DIAGNOSIS — E7849 Other hyperlipidemia: Secondary | ICD-10-CM | POA: Diagnosis not present

## 2023-05-28 DIAGNOSIS — I10 Essential (primary) hypertension: Secondary | ICD-10-CM

## 2023-05-28 DIAGNOSIS — E038 Other specified hypothyroidism: Secondary | ICD-10-CM

## 2023-05-28 DIAGNOSIS — R7301 Impaired fasting glucose: Secondary | ICD-10-CM | POA: Diagnosis not present

## 2023-05-28 DIAGNOSIS — E559 Vitamin D deficiency, unspecified: Secondary | ICD-10-CM

## 2023-05-28 MED ORDER — ROSUVASTATIN CALCIUM 40 MG PO TABS: 40.0000 mg | ORAL_TABLET | Freq: Every day | ORAL | 1 refills | Status: DC

## 2023-05-28 NOTE — Patient Instructions (Addendum)
I appreciate the opportunity to provide care to you today!    Follow up:  4 months  Labs: please stop by the lab during the week  to get your blood drawn (CBC, CMP, TSH, Lipid profile, HgA1c, Vit D)  Schedule medicare annual wellness visit  Here are some foods to avoid or reduce in your diet to help manage cholesterol levels:  Fried Foods:Deep-fried items such as french fries, fried chicken, and fried snacks are high in unhealthy fats and can raise LDL (bad) cholesterol levels. Processed Meats:Foods like bacon, sausage, hot dogs, and deli meats are often high in saturated fat and cholesterol. Full-Fat Dairy Products:Whole milk, full-fat yogurt, butter, cream, and cheese are rich in saturated fats, which can increase cholesterol levels. Baked Goods and Sweets:Pastries, cakes, cookies, and donuts often contain trans fats and added sugars, which can raise LDL cholesterol and lower HDL (good) cholesterol. Red Meat:Beef, lamb, and pork are high in saturated fat. Lean cuts or plant-based protein alternatives are better options. Lard and Shortening:Used in some baked goods, lard and shortening are high in trans fats and should be avoided. Fast Food:Many fast food items are cooked with unhealthy oils and contain high amounts of saturated and trans fats. Processed Snacks:Chips, crackers, and certain microwave popcorns can contain trans fats and high levels of unhealthy oils. Shellfish:While nutritious in other ways, some shellfish like shrimp, lobster, and crab are high in cholesterol. They should be consumed in moderation. Coconut and Palm Oils:these oils are high in saturated fat and can raise cholesterol levels when used in cooking or baking.   Referrals today-     Please continue to a heart-healthy diet and increase your physical activities. Try to exercise for at least five days a week.    It was a pleasure to see you and I look forward to continuing to work together on your health and  well-being. Please do not hesitate to call the office if you need care or have questions about your care.  In case of emergency, please visit the Emergency Department for urgent care, or contact our clinic at [phone] to schedule an appointment. We're here to help you!   Have a wonderful day and week. With Gratitude, Gilmore Laroche MSN, FNP-BC

## 2023-05-28 NOTE — Assessment & Plan Note (Signed)
The patient was encouraged to continue taking rosuvastatin 40 mg daily for cholesterol management. Lifestyle modifications were also discussed, including avoiding simple carbohydrates such as cakes, sweet desserts, ice cream, soda (diet or regular), sweet tea, candies, chips, cookies, store-bought juices, excessive alcohol (more than 1-2 drinks per day), lemonade, artificial sweeteners, donuts, coffee creamers, and sugar-free products. Additionally, the patient was advised to reduce the consumption of greasy, fatty foods and increase physical activity to support cardiovascular health. The patient verbalized understanding and is aware of the plan of care.  Lab Results  Component Value Date   CHOL 266 (H) 01/31/2022   HDL 37 (L) 01/31/2022   LDLCALC 176 (H) 01/31/2022   TRIG 263 (H) 01/31/2022   CHOLHDL 7.2 01/31/2022

## 2023-05-28 NOTE — Assessment & Plan Note (Signed)
Controlled on metoprolol 25 mg daily Asymptomatic in the clinic Reports treatment compliance A low-sodium diet of less than 2,300 mg daily is recommended, along with moderate-intensity physical activity for at least 150 minutes per week. The patient is encouraged to maintain these lifestyle modifications to help manage her blood pressure effectively. Long-term considerations were discussed, emphasizing that uncontrolled hypertension increases the risk of cardiovascular diseases, including stroke, coronary artery disease, and heart failure. The patient is encouraged to seek emergency care if blood pressure exceeds 180/120 and is accompanied by symptoms such as headaches, chest pain, palpitations, blurred vision, or dizziness. She verbalized understanding and will follow up as scheduled.  BP Readings from Last 3 Encounters:  05/28/23 116/69  01/24/23 128/82  11/03/22 126/69

## 2023-05-28 NOTE — Progress Notes (Signed)
Established Patient Office Visit  Subjective:  Patient ID: Barry Powers, male    DOB: [birth year]  Age: 78 y.o. MRN: [id number]  CC:  Chief Complaint  Patient presents with   Follow-up    Follow up requesting 90 day refill on rosuvastatin    HPI Barry Powers is a 78 y.o. male with past medical history of Hereditary spastic paraplegia, hyperlipidemia, and hypertension  presents for f/u of  chronic medical conditions. For the details of today's visit, please refer to the assessment and plan.     Past Medical History:  Diagnosis Date   Coronary artery disease    Hypercholesteremia    Hypertension    NSTEMI (non-ST elevated myocardial infarction) [facility])     Past Surgical History:  Procedure Laterality Date   CERVICAL LAMINECTOMY     CYSTOSCOPY N/A 07/24/2022   Procedure: CYSTOSCOPY;  Surgeon: Malen Gauze, MD;  Location: [facility];  Service: Urology;  Laterality: N/A;   LUMBAR SPINE SURGERY     TRANSURETHRAL RESECTION OF PROSTATE N/A 07/24/2022   Procedure: TRANSURETHRAL RESECTION OF THE PROSTATE (TURP);  Surgeon: Malen Gauze, MD;  Location: [facility];  Service: Urology;  Laterality: N/A;    Family History  Problem Relation Age of Onset   Alzheimer's disease Mother    Aneurysm Father    COPD Sister     Social History   Socioeconomic History   Marital status: Married    Spouse name: Not on file   Number of children: Not on file   Years of education: Not on file   Highest education level: Not on file  Occupational History   Not on file  Tobacco Use   Smoking status: Never   Smokeless tobacco: Never  Vaping Use   Vaping status: Never Used  Substance and Sexual Activity   Alcohol use: No   Drug use: No   Sexual activity: Not Currently  Other Topics Concern   Not on file  Social History Narrative   Not on file   Social Drivers of Health   Financial Resource Strain: Not on file  Food Insecurity: No Food Insecurity (10/10/2022)   Hunger Vital Sign     Worried About Running Out of Food in the Last Year: Never true    Ran Out of Food in the Last Year: Never true  Transportation Needs: No Transportation Needs (10/10/2022)   PRAPARE - Administrator, Civil Service (Medical): No    Lack of Transportation (Non-Medical): No  Physical Activity: Not on file  Stress: Not on file  Social Connections: Not on file  Intimate Partner Violence: Not At Risk (10/10/2022)   Humiliation, Afraid, Rape, and Kick questionnaire    Fear of Current or Ex-Partner: No    Emotionally Abused: No    Physically Abused: No    Sexually Abused: No    Outpatient Medications Prior to Visit  Medication Sig Dispense Refill   acetaminophen (TYLENOL) 325 MG tablet Take 2 tablets (650 mg total) by mouth every 6 (six) hours as needed for mild pain or moderate pain (or Fever >/= 101).     albuterol (VENTOLIN HFA) 108 (90 Base) MCG/ACT inhaler Inhale 2 puffs into the lungs every 6 (six) hours as needed for wheezing or shortness of breath. 8 g 2   aspirin EC 81 MG tablet Take 81 mg by mouth daily.     hydrOXYzine (VISTARIL) 25 MG capsule Take 1 capsule (25 mg total) by mouth at  bedtime as needed. 30 capsule 0   metoprolol succinate (TOPROL-XL) 25 MG 24 hr tablet Take 1 tablet (25 mg total) by mouth daily. 90 tablet 1   nitroGLYCERIN (NITROSTAT) 0.4 MG SL tablet Place 1 tablet (0.4 mg total) under the tongue every 5 (five) minutes as needed for chest pain. 25 tablet 3   Trospium Chloride 60 MG CP24 Take 1 capsule (60 mg total) by mouth daily. 30 capsule 11   rosuvastatin (CRESTOR) 40 MG tablet Take 1 tablet (40 mg total) by mouth daily. Please call office to schedule appt for further refills. Thank you 30 tablet 0   No facility-administered medications prior to visit.    Allergies  Allergen Reactions   Baclofen Other (See Comments)    Bad dreams    Penicillins Other (See Comments)    "Blacked-out"    ROS Review of Systems  Constitutional:  Negative for  fatigue and fever.  Eyes:  Negative for visual disturbance.  Respiratory:  Negative for chest tightness and shortness of breath.   Cardiovascular:  Negative for chest pain and palpitations.  Neurological:  Negative for dizziness and headaches.      Objective:    Physical Exam HENT:     Head: Normocephalic.     Right Ear: External ear normal.     Left Ear: External ear normal.     Nose: No congestion or rhinorrhea.     Mouth/Throat:     Mouth: Mucous membranes are moist.  Cardiovascular:     Rate and Rhythm: Regular rhythm.     Heart sounds: No murmur heard. Pulmonary:     Effort: No respiratory distress.     Breath sounds: Normal breath sounds.  Neurological:     Mental Status: He is alert.     BP 116/69 (BP Location: Left Arm, Patient Position: Sitting, Cuff Size: Large)   Pulse (!) 57   Ht 5\' 9"  (1.753 m)   Wt 165 lb (74.8 kg)   SpO2 93%   BMI 24.37 kg/m  Wt Readings from Last 3 Encounters:  05/28/23 165 lb (74.8 kg)  01/24/23 165 lb (74.8 kg)  11/03/22 171 lb 0.6 oz (77.6 kg)    No results found for: "TSH" Lab Results  Component Value Date   WBC 7.6 10/25/2022   HGB 14.1 10/25/2022   HCT 46.0 10/25/2022   MCV 89 10/25/2022   PLT 285 10/25/2022   Lab Results  Component Value Date   NA 142 10/25/2022   K 4.5 10/25/2022   CO2 24 10/25/2022   GLUCOSE 98 10/25/2022   BUN 10 10/25/2022   CREATININE 1.51 (H) 10/25/2022   BILITOT 1.3 (H) 10/10/2022   ALKPHOS 43 10/10/2022   AST 16 10/10/2022   ALT 17 10/10/2022   PROT 6.9 10/10/2022   ALBUMIN 3.5 10/10/2022   CALCIUM 9.5 10/25/2022   ANIONGAP 5 10/13/2022   EGFR 48 (L) 10/25/2022   Lab Results  Component Value Date   CHOL 266 (H) 01/31/2022   Lab Results  Component Value Date   HDL 37 (L) 01/31/2022   Lab Results  Component Value Date   LDLCALC 176 (H) 01/31/2022   Lab Results  Component Value Date   TRIG 263 (H) 01/31/2022   Lab Results  Component Value Date   CHOLHDL 7.2 01/31/2022    No results found for: "HGBA1C"    Assessment & Plan:  Primary hypertension Assessment & Plan: Controlled on metoprolol 25 mg daily Asymptomatic in the clinic Reports treatment  compliance A low-sodium diet of less than 2,300 mg daily is recommended, along with moderate-intensity physical activity for at least 150 minutes per week. The patient is encouraged to maintain these lifestyle modifications to help manage her blood pressure effectively. Long-term considerations were discussed, emphasizing that uncontrolled hypertension increases the risk of cardiovascular diseases, including stroke, coronary artery disease, and heart failure. The patient is encouraged to seek emergency care if blood pressure exceeds 180/120 and is accompanied by symptoms such as headaches, chest pain, palpitations, blurred vision, or dizziness. She verbalized understanding and will follow up as scheduled.  BP Readings from Last 3 Encounters:  05/28/23 116/69  01/24/23 128/82  11/03/22 126/69      Other hyperlipidemia Assessment & Plan: The patient was encouraged to continue taking rosuvastatin 40 mg daily for cholesterol management. Lifestyle modifications were also discussed, including avoiding simple carbohydrates such as cakes, sweet desserts, ice cream, soda (diet or regular), sweet tea, candies, chips, cookies, store-bought juices, excessive alcohol (more than 1-2 drinks per day), lemonade, artificial sweeteners, donuts, coffee creamers, and sugar-free products. Additionally, the patient was advised to reduce the consumption of greasy, fatty foods and increase physical activity to support cardiovascular health. The patient verbalized understanding and is aware of the plan of care.  Lab Results  Component Value Date   CHOL 266 (H) 01/31/2022   HDL 37 (L) 01/31/2022   LDLCALC 176 (H) 01/31/2022   TRIG 263 (H) 01/31/2022   CHOLHDL 7.2 01/31/2022     Orders: -     Lipid panel -     CMP14+EGFR -     CBC  with Differential/Platelet -     Rosuvastatin Calcium; Take 1 tablet (40 mg total) by mouth daily. Please call office to schedule appt for further refills. Thank you  Dispense: 90 tablet; Refill: 1  IFG (impaired fasting glucose) -     Hemoglobin A1c  Vitamin D deficiency -     VITAMIN D 25 Hydroxy (Vit-D Deficiency, Fractures)  Need for hepatitis C screening test -     Hepatitis C antibody  TSH (thyroid-stimulating hormone deficiency) -     TSH + free T4  Note: This chart has been completed using Engineer, civil (consulting) software, and while attempts have been made to ensure accuracy, certain words and phrases may not be transcribed as intended.    Follow-up: Return in about 4 months (around 09/25/2023).   Gilmore Laroche, FNP

## 2023-06-21 ENCOUNTER — Other Ambulatory Visit: Payer: Self-pay | Admitting: Family Medicine

## 2023-06-21 DIAGNOSIS — I10 Essential (primary) hypertension: Secondary | ICD-10-CM

## 2023-06-21 NOTE — Telephone Encounter (Signed)
 Copied from CRM [id number]. Topic: Clinical - Medication Refill >> Jun 21, 2023  2:23 PM Tiffany B wrote: Most Recent Primary Care Visit:  Provider: Gilmore Laroche  Department: [facility]  Visit Type: OFFICE VISIT  Date: 05/28/2023  Medication:   Has the patient contacted their pharmacy? Yes  (Agent: If yes, when and what did the pharmacy advise?)Caller states she contact the pharmacy 2 days ago and the pharmacy stated they have not received a response. Caller would like request expedited  Is this the correct pharmacy for this prescription? Yes  This is the patient's preferred pharmacy:  [facility] [address], [place] - [address] [place] [address] Phone: [phone] Fax: [phone]   Has the prescription been filled recently? Yes  Is the patient out of the medication? Yes  Has the patient been seen for an appointment in the last year OR does the patient have an upcoming appointment? Yes  Can we respond through MyChart? No  Agent: Please be advised that Rx refills may take up to 3 business days. We ask that you follow-up with your pharmacy.

## 2023-06-22 ENCOUNTER — Ambulatory Visit: Payer: Medicare Other | Admitting: Urology

## 2023-06-22 MED ORDER — METOPROLOL SUCCINATE ER 25 MG PO TB24: 25.0000 mg | ORAL_TABLET | Freq: Every day | ORAL | 1 refills | Status: AC

## 2023-06-25 ENCOUNTER — Other Ambulatory Visit: Payer: Self-pay | Admitting: Family Medicine

## 2023-06-25 NOTE — Telephone Encounter (Signed)
 [facility] called and spoke to [facility], Technician about the refill(s) metoprolol requested. Advised it was sent on 06/22/23 #90/1 refill(s). She says he had a refill left on the old Rx and it's been there for 7 days waiting to be picked up, the current one sent is on file for the next refill. Patient's wife Bonita Quin called and advised of the above. She says she will have her daughter to pick it up.

## 2023-06-25 NOTE — Telephone Encounter (Signed)
 Copied from CRM [id number]. Topic: Clinical - Prescription Issue >> Jun 25, 2023  1:58 PM Nyra Capes wrote: Reason for CRM: Joyce Gross, wife calling in,needs a refill for metoprolol succinate (TOPROL-XL) 25 MG 24 hr tablet  wife called pharmacy this morning, who said Rx is not there. Patient is COMPLETLEY OUT last time patient took a pill was last Thursday.

## 2023-08-14 ENCOUNTER — Ambulatory Visit (INDEPENDENT_AMBULATORY_CARE_PROVIDER_SITE_OTHER): Payer: Medicare Other

## 2023-08-14 VITALS — Ht 69.0 in | Wt 160.0 lb

## 2023-08-14 DIAGNOSIS — Z0000 Encounter for general adult medical examination without abnormal findings: Secondary | ICD-10-CM | POA: Diagnosis not present

## 2023-08-14 DIAGNOSIS — Z5320 Procedure and treatment not carried out because of patient's decision for unspecified reasons: Secondary | ICD-10-CM

## 2023-08-14 DIAGNOSIS — Z2821 Immunization not carried out because of patient refusal: Secondary | ICD-10-CM | POA: Diagnosis not present

## 2023-08-14 DIAGNOSIS — Z1159 Encounter for screening for other viral diseases: Secondary | ICD-10-CM

## 2023-08-14 NOTE — Patient Instructions (Signed)
 [name] , Thank you for taking time to come for your Medicare Wellness Visit. I appreciate your ongoing commitment to your health goals. Please review the following plan we discussed and let me know if I can assist you in the future.   Referrals/Orders/Follow-Ups/Clinician Recommendations:    Next Medicare AWV: Aug 18, 2024 at 11:20 am telephone visit.      Aim for 30 minutes of exercise or brisk walking, 6-8 glasses of water , and 5 servings of fruits and vegetables each day.   This is a list of the screening recommended for you and due dates:  Health Maintenance  Topic Date Due   Hepatitis C Screening  Never done   Pneumonia Vaccine (1 of 2 - PCV) Never done   Zoster (Shingles) Vaccine (1 of 2) Never done   COVID-19 Vaccine (4 - 2024-25 season) 12/10/2022   Flu Shot  11/09/2023   Medicare Annual Wellness Visit  08/13/2024   HPV Vaccine  Aged Out   Meningitis B Vaccine  Aged Out   DTaP/Tdap/Td vaccine  Discontinued    Advanced directives: (Declined) Advance directive discussed with you today. Even though you declined this today, please call our office should you change your mind, and we can give you the proper paperwork for you to fill out. Advance Care Planning is important because it:  [x]  Makes sure you receive the medical care that is consistent with your values, goals, and preferences  [x]  It provides guidance to your family and loved ones and it also reduces their decisional burden about whether or not they are making the right decisions based on what you want done  Follow the link provided in your after visit summary or read over the paperwork we have mailed to you to help you started getting your Advance Directives in place. If you need assistance in completing these, please reach out to us  so that we can help you!  Next Medicare Annual Wellness Visit scheduled for next year: yes  Understanding Your Risk for Falls Millions of people have serious injuries from falls each  year. It is important to understand your risk of falling. Talk with your health care provider about your risk and what you can do to lower it. If you do have a serious fall, make sure to tell your provider. Falling once raises your risk of falling again. How can falls affect me? Serious injuries from falls are common. These include: Broken bones, such as hip fractures. Head injuries, such as traumatic brain injuries (TBI) or concussions. A fear of falling can cause you to avoid activities and stay at home. This can make your muscles weaker and raise your risk for a fall. What can increase my risk? There are a number of risk factors that increase your risk for falling. The more risk factors you have, the higher your risk of falling. Serious injuries from a fall happen most often to people who are older than 77 years old. Teenagers and young adults ages 85-29 are also at higher risk. Common risk factors include: Weakness in the lower body. Being generally weak or confused due to long-term (chronic) illness. Dizziness or balance problems. Poor vision. Medicines that cause dizziness or drowsiness. These may include: Medicines for your blood pressure, heart, anxiety, insomnia, or swelling (edema). Pain medicines. Muscle relaxants. Other risk factors include: Drinking alcohol. Having had a fall in the past. Having foot pain or wearing improper footwear. Working at a dangerous job. Having any of the following in your  home: Tripping hazards, such as floor clutter or loose rugs. Poor lighting. Pets. Having dementia or memory loss. What actions can I take to lower my risk of falling?  Physical activity Stay physically fit. Do strength and balance exercises. Consider taking a regular class to build strength and balance. Yoga and tai chi are good options. Vision Have your eyes checked every year and your prescription for glasses or contacts updated as needed. Shoes and walking aids Wear  non-skid shoes. Wear shoes that have rubber soles and low heels. Do not wear high heels. Do not walk around the house in socks or slippers. Use a cane or walker as told by your provider. Home safety Attach secure railings on both sides of your stairs. Install grab bars for your bathtub, shower, and toilet. Use a non-skid mat in your bathtub or shower. Attach bath mats securely with double-sided, non-slip rug tape. Use good lighting in all rooms. Keep a flashlight near your bed. Make sure there is a clear path from your bed to the bathroom. Use night-lights. Do not use throw rugs. Make sure all carpeting is taped or tacked down securely. Remove all clutter from walkways and stairways, including extension cords. Repair uneven or broken steps and floors. Avoid walking on icy or slippery surfaces. Walk on the grass instead of on icy or slick sidewalks. Use ice melter to get rid of ice on walkways in the winter. Use a cordless phone. Questions to ask your health care provider Can you help me check my risk for a fall? Do any of my medicines make me more likely to fall? Should I take a vitamin D supplement? What exercises can I do to improve my strength and balance? Should I make an appointment to have my vision checked? Do I need a bone density test to check for weak bones (osteoporosis)? Would it help to use a cane or a walker? Where to find more information Centers for Disease Control and Prevention, [employer]: TonerPromos.no Community-Based Fall Prevention Programs: TonerPromos.no [employer] on Aging: BaseRingTones.pl Contact a health care provider if: You fall at home. You are afraid of falling at home. You feel weak, drowsy, or dizzy. This information is not intended to replace advice given to you by your health care provider. Make sure you discuss any questions you have with your health care provider. Document Revised: 11/28/2021 Document Reviewed: 11/28/2021 Elsevier Patient Education  2024  [employer].

## 2023-08-14 NOTE — Progress Notes (Signed)
 Please attest and cosign this visit due to patients primary care provider not being in the office at the time the visit was completed.   Subjective:   Barry Powers is a 78 y.o. who presents for a Medicare Wellness preventive visit.  Visit Complete: Virtual I connected with  Barry Powers on 08/14/23 by a audio enabled telemedicine application and verified that I am speaking with the correct person using two identifiers.  Patient Location: Home  Provider Location: Home Office  I discussed the limitations of evaluation and management by telemedicine. The patient expressed understanding and agreed to proceed.  Vital Signs: Because this visit was a virtual/telehealth visit, some criteria may be missing or patient reported. Any vitals not documented were not able to be obtained and vitals that have been documented are patient reported.  VideoDeclined- This patient declined Librarian, academic. Therefore the visit was completed with audio only.  Persons Participating in Visit: Patient.  AWV Questionnaire: No: Patient Medicare AWV questionnaire was not completed prior to this visit.  Cardiac Risk Factors include: advanced age (>57men, >58 women);sedentary lifestyle;male gender;hypertension;dyslipidemia     Objective:    Today's Vitals   08/14/23 1007  Weight: 160 lb (72.6 kg)  Height: 5\' 9"  (1.753 m)   Body mass index is 23.63 kg/m.     08/14/2023   10:11 AM 10/10/2022    9:48 AM 07/24/2022    4:00 PM 07/24/2022   12:15 PM 07/20/2022    9:38 AM 03/29/2022    1:29 PM 03/29/2022    1:22 PM  Advanced Directives  Does Patient Have a Medical Advance Directive? No Yes No Yes No Yes Yes  Type of Advance Directive  Living will    Living will   Does patient want to make changes to medical advance directive?  No - Patient declined No - Patient declined No - Patient declined  No - Patient declined No - Patient declined  Would patient like information on  creating a medical advance directive? No - Patient declined  No - Patient declined No - Patient declined No - Patient declined      Current Medications (verified) Outpatient Encounter Medications as of 08/14/2023  Medication Sig   acetaminophen  (TYLENOL ) 325 MG tablet Take 2 tablets (650 mg total) by mouth every 6 (six) hours as needed for mild pain or moderate pain (or Fever >/= 101).   albuterol  (VENTOLIN  HFA) 108 (90 Base) MCG/ACT inhaler Inhale 2 puffs into the lungs every 6 (six) hours as needed for wheezing or shortness of breath.   aspirin  EC 81 MG tablet Take 81 mg by mouth daily.   hydrOXYzine  (VISTARIL ) 25 MG capsule Take 1 capsule (25 mg total) by mouth at bedtime as needed.   metoprolol  succinate (TOPROL -XL) 25 MG 24 hr tablet Take 1 tablet (25 mg total) by mouth daily.   nitroGLYCERIN  (NITROSTAT ) 0.4 MG SL tablet Place 1 tablet (0.4 mg total) under the tongue every 5 (five) minutes as needed for chest pain.   rosuvastatin  (CRESTOR ) 40 MG tablet Take 1 tablet (40 mg total) by mouth daily. Please call office to schedule appt for further refills. Thank you   Trospium  Chloride 60 MG CP24 Take 1 capsule (60 mg total) by mouth daily.   No facility-administered encounter medications on file as of 08/14/2023.    Allergies (verified) Baclofen and Penicillins   History: Past Medical History:  Diagnosis Date   Coronary artery disease    Hypercholesteremia  Hypertension    NSTEMI (non-ST elevated myocardial infarction) [facility])    Past Surgical History:  Procedure Laterality Date   CERVICAL LAMINECTOMY     CYSTOSCOPY N/A 07/24/2022   Procedure: CYSTOSCOPY;  Surgeon: Marco Severs, MD;  Location: [facility];  Service: Urology;  Laterality: N/A;   LUMBAR SPINE SURGERY     TRANSURETHRAL RESECTION OF PROSTATE N/A 07/24/2022   Procedure: TRANSURETHRAL RESECTION OF THE PROSTATE (TURP);  Surgeon: Marco Severs, MD;  Location: [facility];  Service: Urology;  Laterality: N/A;   Family  History  Problem Relation Age of Onset   Alzheimer's disease Mother    Aneurysm Father    COPD Sister    Social History   Socioeconomic History   Marital status: Married    Spouse name: Not on file   Number of children: Not on file   Years of education: Not on file   Highest education level: Not on file  Occupational History   Not on file  Tobacco Use   Smoking status: Never   Smokeless tobacco: Never  Vaping Use   Vaping status: Never Used  Substance and Sexual Activity   Alcohol use: No   Drug use: No   Sexual activity: Not Currently  Other Topics Concern   Not on file  Social History Narrative   Not on file   Social Drivers of Health   Financial Resource Strain: Low Risk  (08/14/2023)   Overall Financial Resource Strain (CARDIA)    Difficulty of Paying Living Expenses: Not hard at all  Food Insecurity: No Food Insecurity (08/14/2023)   Hunger Vital Sign    Worried About Running Out of Food in the Last Year: Never true    Ran Out of Food in the Last Year: Never true  Transportation Needs: No Transportation Needs (08/14/2023)   PRAPARE - Administrator, Civil Service (Medical): No    Lack of Transportation (Non-Medical): No  Physical Activity: Inactive (08/14/2023)   Exercise Vital Sign    Days of Exercise per Week: 0 days    Minutes of Exercise per Session: 0 min  Stress: No Stress Concern Present (08/14/2023)   [employer] of Occupational Health - Occupational Stress Questionnaire    Feeling of Stress : Not at all  Social Connections: Socially Integrated (08/14/2023)   Social Connection and Isolation Panel [NHANES]    Frequency of Communication with Friends and Family: More than three times a week    Frequency of Social Gatherings with Friends and Family: More than three times a week    Attends Religious Services: More than 4 times per year    Active Member of [employer] or Organizations: Yes    Attends Engineer, structural: More than 4 times per  year    Marital Status: Married    Tobacco Counseling Counseling given: Yes    Clinical Intake:  Pre-visit preparation completed: Yes  Pain : No/denies pain     BMI - recorded: 23.63 Nutritional Status: BMI of 19-24  Normal Nutritional Risks: None Diabetes: No  No results found for: "HGBA1C"   How often do you need to have someone help you when you read instructions, pamphlets, or other written materials from your doctor or pharmacy?: 1 - Never  Interpreter Needed?: No  Information entered by :: Sally Crazier CMA   Activities of Daily Living     08/14/2023   10:09 AM 10/10/2022    4:04 PM  In your present state of health,  do you have any difficulty performing the following activities:  Hearing? 1 1  Comment patient states he has a little difficulty hearing but refuses to wear hearing aids. declines a referral for testing today   Vision? 0 0  Difficulty concentrating or making decisions? 0 0  Walking or climbing stairs? 1 0  Comment uses a cane and walker   Dressing or bathing? 0 0  Doing errands, shopping? 0 0  Preparing Food and eating ? Y   Comment someone else does the cooking for him   Using the Toilet? N   In the past six months, have you accidently leaked urine? N   Do you have problems with loss of bowel control? N   Managing your Medications? N   Managing your Finances? N   Housekeeping or managing your Housekeeping? Y   Comment daughter takes care of the home.     Patient Care Team: Zarwolo, Gloria, FNP as PCP - General (Family Medicine) Mallipeddi, Kennyth Pean, MD as PCP - Cardiology (Cardiology) Claretta Croft Arden Beck, MD as Consulting Physician (Urology) Pleasant Brilliant, MD as Attending Physician (Orthopedic Surgery)  Indicate any recent Medical Services you may have received from other than [facility] providers in the past year (date may be approximate).     Assessment:   This is a routine wellness examination for Barry Powers.  Hearing/Vision screen Hearing  Screening - Comments:: Patient states he has a slight hearing loss but refuses to wear hearing aids. Declines referral for testing today.  Vision Screening - Comments:: Wears rx glasses - up to date with routine eye exams  Patient sees [facility] [place]    Goals Addressed             This Visit's Progress    Patient Stated       To improve mobility and to improve health       Depression Screen     08/14/2023   10:17 AM 05/28/2023   10:59 AM 01/24/2023   11:34 AM 01/24/2023   11:25 AM 10/24/2022    1:02 PM 06/12/2022    9:20 AM 04/21/2022    2:17 PM  PHQ 2/9 Scores  PHQ - 2 Score 0 0 0 0 0 3 0  PHQ- 9 Score 0  0 0 0 15 2    Fall Risk     08/14/2023   10:12 AM 05/28/2023   10:59 AM 01/24/2023   11:34 AM 01/24/2023   11:24 AM 10/24/2022    1:02 PM  Fall Risk   Falls in the past year? 1 0 1 0 0  Number falls in past yr: 1 0 1 0 0  Injury with Fall? 1 0 1 0 0  Risk for fall due to : History of fall(s);Impaired balance/gait;Orthopedic patient;Impaired mobility No Fall Risks Other (Comment) No Fall Risks No Fall Risks  Follow up Falls prevention discussed;Falls evaluation completed;Education provided Falls evaluation completed Falls evaluation completed Falls evaluation completed Falls evaluation completed    MEDICARE RISK AT HOME:  Medicare Risk at Home Any stairs in or around the home?: Yes If so, are there any without handrails?: Yes Home free of loose throw rugs in walkways, pet beds, electrical cords, etc?: Yes Adequate lighting in your home to reduce risk of falls?: Yes Life alert?: No Use of a cane, walker or w/c?: Yes Grab bars in the bathroom?: No Shower chair or bench in shower?: Yes Elevated toilet seat or a handicapped toilet?: Yes  TIMED UP AND GO:  Was the test performed?  No  Cognitive Function: 6CIT completed        08/14/2023   10:14 AM  6CIT Screen  What Year? 0 points  What month? 0 points  What time? 0 points  Count back from 20 0 points   Months in reverse 0 points  Repeat phrase 0 points  Total Score 0 points    Immunizations Immunization History  Administered Date(s) Administered   Moderna SARS-COV2 Booster Vaccination 03/18/2020   Moderna Sars-Covid-2 Vaccination 06/20/2019, 07/23/2019    Screening Tests Health Maintenance  Topic Date Due   Hepatitis C Screening  Never done   Pneumonia Vaccine 81+ Years old (1 of 2 - PCV) Never done   Zoster Vaccines- Shingrix (1 of 2) Never done   COVID-19 Vaccine (4 - 2024-25 season) 12/10/2022   INFLUENZA VACCINE  11/09/2023   Medicare Annual Wellness (AWV)  08/13/2024   HPV VACCINES  Aged Out   Meningococcal B Vaccine  Aged Out   DTaP/Tdap/Td  Discontinued    Health Maintenance  Health Maintenance Due  Topic Date Due   Hepatitis C Screening  Never done   Pneumonia Vaccine 35+ Years old (1 of 2 - PCV) Never done   Zoster Vaccines- Shingrix (1 of 2) Never done   COVID-19 Vaccine (4 - 2024-25 season) 12/10/2022   Health Maintenance Items Addressed: Patient declined any screenings or vaccinations or than Hep C screening  Additional Screening:  Vision Screening: Recommended annual ophthalmology exams for early detection of glaucoma and other disorders of the eye. Patient is up to date. Last saw optometrist in [facility] in [place] Dental Screening: Recommended annual dental exams for proper oral hygiene  Community Resource Referral / Chronic Care Management: CRR required this visit?  No   CCM required this visit?  No     Plan:     I have personally reviewed and noted the following in the patient's chart:   Medical and social history Use of alcohol, tobacco or illicit drugs  Current medications and supplements including opioid prescriptions. Patient is not currently taking opioid prescriptions. Functional ability and status Nutritional status Physical activity Advanced directives List of other physicians Hospitalizations, surgeries, and ER visits in  previous 12 months Vitals Screenings to include cognitive, depression, and falls Referrals and appointments  In addition, I have reviewed and discussed with patient certain preventive protocols, quality metrics, and best practice recommendations. A written personalized care plan for preventive services as well as general preventive health recommendations were provided to patient.      Erikka Follmer, CMA   08/14/2023   After Visit Summary: (Declined) Due to this being a telephonic visit, with patients personalized plan was offered to patient but patient Declined AVS at this time   Notes: Please refer to Routing Comments.

## 2023-09-21 ENCOUNTER — Ambulatory Visit: Admitting: Urology

## 2023-09-27 ENCOUNTER — Ambulatory Visit: Payer: Self-pay | Admitting: Family Medicine

## 2023-11-26 ENCOUNTER — Ambulatory Visit: Admitting: Family Medicine

## 2023-12-05 ENCOUNTER — Encounter: Payer: Self-pay | Admitting: Family Medicine

## 2024-02-04 ENCOUNTER — Other Ambulatory Visit: Payer: Self-pay | Admitting: Family Medicine

## 2024-02-04 DIAGNOSIS — E7849 Other hyperlipidemia: Secondary | ICD-10-CM

## 2024-04-04 ENCOUNTER — Ambulatory Visit: Payer: Self-pay | Admitting: Family Medicine

## 2024-04-07 ENCOUNTER — Ambulatory Visit: Payer: Self-pay | Admitting: Family Medicine

## 2024-08-18 ENCOUNTER — Ambulatory Visit
